# Patient Record
Sex: Female | Born: 1950
Health system: Southern US, Community
[De-identification: ages and names within clinical notes are randomized; demographics above are authoritative.]

## PROBLEM LIST (undated history)

## (undated) DIAGNOSIS — T7840XA Allergy, unspecified, initial encounter: Secondary | ICD-10-CM

## (undated) DIAGNOSIS — Z5189 Encounter for other specified aftercare: Secondary | ICD-10-CM

## (undated) DIAGNOSIS — C801 Malignant (primary) neoplasm, unspecified: Secondary | ICD-10-CM

## (undated) DIAGNOSIS — I10 Essential (primary) hypertension: Secondary | ICD-10-CM

## (undated) DIAGNOSIS — D72819 Decreased white blood cell count, unspecified: Secondary | ICD-10-CM

## (undated) DIAGNOSIS — C50919 Malignant neoplasm of unspecified site of unspecified female breast: Secondary | ICD-10-CM

## (undated) DIAGNOSIS — K219 Gastro-esophageal reflux disease without esophagitis: Secondary | ICD-10-CM

## (undated) DIAGNOSIS — E559 Vitamin D deficiency, unspecified: Secondary | ICD-10-CM

## (undated) HISTORY — DX: Malignant (primary) neoplasm, unspecified: C80.1

## (undated) HISTORY — PX: TUBAL LIGATION: SHX77

## (undated) HISTORY — PX: LIMBAL STEM CELL TRANSPLANT: SHX1969

## (undated) HISTORY — DX: Gastro-esophageal reflux disease without esophagitis: K21.9

## (undated) HISTORY — DX: Encounter for other specified aftercare: Z51.89

## (undated) HISTORY — DX: Allergy, unspecified, initial encounter: T78.40XA

## (undated) HISTORY — PX: MODIFIED RADICAL MASTECTOMY: SHX5703

## (undated) HISTORY — PX: MASTECTOMY: SHX3

## (undated) HISTORY — DX: Essential (primary) hypertension: I10

## (undated) HISTORY — PX: BREAST SURGERY: SHX581

## (undated) HISTORY — PX: WISDOM TOOTH EXTRACTION: SHX21

---

## 2012-05-05 DIAGNOSIS — I10 Essential (primary) hypertension: Secondary | ICD-10-CM | POA: Insufficient documentation

## 2012-05-05 DIAGNOSIS — Z853 Personal history of malignant neoplasm of breast: Secondary | ICD-10-CM | POA: Insufficient documentation

## 2012-05-05 DIAGNOSIS — L609 Nail disorder, unspecified: Secondary | ICD-10-CM | POA: Insufficient documentation

## 2012-05-05 DIAGNOSIS — IMO0001 Reserved for inherently not codable concepts without codable children: Secondary | ICD-10-CM | POA: Insufficient documentation

## 2012-05-05 DIAGNOSIS — K219 Gastro-esophageal reflux disease without esophagitis: Secondary | ICD-10-CM | POA: Insufficient documentation

## 2012-05-05 HISTORY — DX: Personal history of malignant neoplasm of breast: Z85.3

## 2012-09-25 DIAGNOSIS — D2360 Other benign neoplasm of skin of unspecified upper limb, including shoulder: Secondary | ICD-10-CM | POA: Insufficient documentation

## 2014-07-16 DIAGNOSIS — R942 Abnormal results of pulmonary function studies: Secondary | ICD-10-CM | POA: Insufficient documentation

## 2014-07-16 DIAGNOSIS — T783XXA Angioneurotic edema, initial encounter: Secondary | ICD-10-CM | POA: Insufficient documentation

## 2014-09-03 DIAGNOSIS — R059 Cough, unspecified: Secondary | ICD-10-CM | POA: Insufficient documentation

## 2014-12-22 DIAGNOSIS — C50911 Malignant neoplasm of unspecified site of right female breast: Secondary | ICD-10-CM | POA: Insufficient documentation

## 2017-01-24 DIAGNOSIS — C50911 Malignant neoplasm of unspecified site of right female breast: Secondary | ICD-10-CM | POA: Diagnosis not present

## 2017-02-04 ENCOUNTER — Encounter: Payer: Self-pay | Admitting: Osteopathic Medicine

## 2017-02-04 ENCOUNTER — Ambulatory Visit (INDEPENDENT_AMBULATORY_CARE_PROVIDER_SITE_OTHER): Payer: 59 | Admitting: Osteopathic Medicine

## 2017-02-04 VITALS — BP 141/88 | HR 58 | Ht 68.0 in | Wt 174.0 lb

## 2017-02-04 DIAGNOSIS — D229 Melanocytic nevi, unspecified: Secondary | ICD-10-CM

## 2017-02-04 DIAGNOSIS — Z0000 Encounter for general adult medical examination without abnormal findings: Secondary | ICD-10-CM | POA: Diagnosis not present

## 2017-02-04 DIAGNOSIS — I10 Essential (primary) hypertension: Secondary | ICD-10-CM

## 2017-02-04 DIAGNOSIS — Z853 Personal history of malignant neoplasm of breast: Secondary | ICD-10-CM

## 2017-02-04 LAB — CBC WITH DIFFERENTIAL/PLATELET
BASOS ABS: 0 {cells}/uL (ref 0–200)
Basophils Relative: 0 %
EOS ABS: 99 {cells}/uL (ref 15–500)
EOS PCT: 3 %
HCT: 38.3 % (ref 35.0–45.0)
HEMOGLOBIN: 12.2 g/dL (ref 11.7–15.5)
Lymphocytes Relative: 52 %
Lymphs Abs: 1716 cells/uL (ref 850–3900)
MCH: 28.6 pg (ref 27.0–33.0)
MCHC: 31.9 g/dL — AB (ref 32.0–36.0)
MCV: 89.9 fL (ref 80.0–100.0)
MONOS PCT: 6 %
MPV: 9.3 fL (ref 7.5–12.5)
Monocytes Absolute: 198 cells/uL — ABNORMAL LOW (ref 200–950)
NEUTROS ABS: 1287 {cells}/uL — AB (ref 1500–7800)
NEUTROS PCT: 39 %
PLATELETS: 183 10*3/uL (ref 140–400)
RBC: 4.26 MIL/uL (ref 3.80–5.10)
RDW: 14.7 % (ref 11.0–15.0)
WBC: 3.3 10*3/uL — ABNORMAL LOW (ref 3.8–10.8)

## 2017-02-04 LAB — COMPLETE METABOLIC PANEL WITH GFR
ALT: 43 U/L — ABNORMAL HIGH (ref 6–29)
AST: 27 U/L (ref 10–35)
Albumin: 4.4 g/dL (ref 3.6–5.1)
Alkaline Phosphatase: 96 U/L (ref 33–130)
BUN: 26 mg/dL — AB (ref 7–25)
CHLORIDE: 101 mmol/L (ref 98–110)
CO2: 26 mmol/L (ref 20–31)
CREATININE: 1.03 mg/dL — AB (ref 0.50–0.99)
Calcium: 9.4 mg/dL (ref 8.6–10.4)
GFR, Est African American: 66 mL/min (ref >=60)
GFR, Est Non African American: 58 mL/min — ABNORMAL LOW (ref >=60)
Glucose, Bld: 70 mg/dL (ref 65–99)
Potassium: 4.5 mmol/L (ref 3.5–5.3)
SODIUM: 138 mmol/L (ref 135–146)
TOTAL PROTEIN: 6.9 g/dL (ref 6.1–8.1)
Total Bilirubin: 0.3 mg/dL (ref 0.2–1.2)

## 2017-02-04 LAB — TSH: TSH: 1.26 m[IU]/L

## 2017-02-04 LAB — LIPID PANEL
Cholesterol: 200 mg/dL — ABNORMAL HIGH (ref <200)
HDL: 74 mg/dL (ref >50)
LDL CALC: 104 mg/dL — AB (ref <100)
Total CHOL/HDL Ratio: 2.7 Ratio (ref <5.0)
Triglycerides: 111 mg/dL (ref <150)
VLDL: 22 mg/dL (ref <30)

## 2017-02-04 NOTE — Progress Notes (Signed)
HPI: Sabrina Abbott is a 66 y.o. female  who presents to [place] today, 02/04/17,  for chief complaint of:  Chief Complaint  Patient presents with  . Establish Care    Very pleasant patient here to establish care. 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 History of angioedema on lisinopril-HCTZ combo. Has been working on diet/lifestyle changes but has gained a bit of weight since being laid off.  Hx R breast CA: Underwent treatment at [facility]. Mastectomy, chemotherapy, radiation, and stem cell therapy. No subsequent concerns on follow-up.  History incontinence: Not particularly bothersome to patient, occasional urgency  Labs reviewed: hx low WBC on and off since 2013  Skin concern: Mole has been present on mons pubis for several years, has not grown or changed in that time. She is thinking about getting this removed.  No medications at this time.   Fasting today in case labs needed.    Past medical, surgical, social and family history reviewed: Patient Active Problem List   Diagnosis Date Noted  . Malignant neoplasm of right breast ([place]) 12/22/2014  . Angioedema 07/16/2014  . Dermatofibroma of deltoid region 09/25/2012  . Elevated blood pressure 05/05/2012  . GERD (gastroesophageal reflux disease) 05/05/2012  . History of breast cancer 05/05/2012   Past Surgical History:  Procedure Laterality Date  . MASTECTOMY Right   . MODIFIED RADICAL MASTECTOMY Right   . WISDOM TOOTH EXTRACTION       Social History  Substance Use Topics  . Smoking status: Never Smoker  . Smokeless tobacco: Never Used  . Alcohol use No   Family History  Problem Relation Age of Onset  . Heart disease Father   . Alcohol abuse Father   . Hyperlipidemia Father   . Hypertension Father   . Stroke Father   . Prostate cancer Brother   . Diabetes Brother   .  Hypertension Brother   . Heart disease Sister      Current medication list and allergy/intolerance information reviewed:   No current outpatient prescriptions on file.   No current facility-administered medications for this visit.    Allergies  Allergen Reactions  . Iodinated Diagnostic Agents Other (See Comments)    Made one side of her "right side of body hurt real bad"  . Lisinopril Other (See Comments)      Review of Systems:  Constitutional:  No  fever, no chills, No recent illness, No unintentional weight changes. No significant fatigue.   HEENT: No  headache, no vision change, no hearing change, No sore throat, No  sinus pressure  Cardiac: No  chest pain, No  pressure, No palpitations  Respiratory:  No  shortness of breath. No  Cough  Gastrointestinal: No  abdominal pain, No  nausea, No  vomiting,  No  blood in stool, No  diarrhea, No  constipation   Musculoskeletal: No new myalgia/arthralgia  Genitourinary: +incontinence, No  abnormal genital bleeding, No abnormal genital discharge  Skin: No  Rash, +other concerning lesion as per HPI  Hem/Onc: No  easy bruising/bleeding, No  abnormal lymph node  Endocrine: No cold intolerance,  No heat intolerance. No polyuria/polydipsia/polyphagia   Neurologic: No  weakness, No  dizziness, No  slurred speech/focal weakness/facial droop  Psychiatric: No  concerns with depression, No  concerns with anxiety, No sleep problems, No mood problems  Depression screen PHQ  2/9 02/04/2017  Decreased Interest 0  Down, Depressed, Hopeless 0  PHQ - 2 Score 0     Exam:  BP (!) 141/88   Pulse (!) 58   Ht 5\' 8"  (1.727 m)   Wt 174 lb (78.9 kg)   BMI 26.46 kg/m   Constitutional: VS see above. General Appearance: alert, well-developed, well-nourished, NAD  Eyes: Normal lids and conjunctive, non-icteric sclera  Ears, Nose, Mouth, Throat: MMM, Normal external inspection ears/nares/mouth/lips/gums.  Neck: No masses, trachea midline.  No thyroid enlargement.  Respiratory: Normal respiratory effort. no wheeze, no rhonchi, no rales  Cardiovascular: S1/S2 normal, no murmur, no rub/gallop auscultated. RRR. No lower extremity edema.   Musculoskeletal: Gait normal.   Neurological: Normal balance/coordination. No tremor.   Skin: warm, dry, intact. No rash/ulcer. No concerning nevi or subq nodules on limited exam. About 0.7 cm diameter benign-appearing nevus on mons pubis, nontender, no erythema/inflammation.  Psychiatric: Normal judgment/insight. Normal mood and affect. Oriented x3.     ASSESSMENT/PLAN:   Essential hypertension - Advised consider medications, discussed goal blood pressure - Plan: CBC with Differential/Platelet, COMPLETE METABOLIC PANEL WITH GFR, Lipid panel, TSH, VITAMIN D 25 Hydroxy (Vit-D Deficiency, Fractures)  History of breast cancer - Plan: MM DIGITAL SCREENING BILATERAL, US BREAST COMPLETE UNI RIGHT INC AXILLA, US BREAST COMPLETE UNI LEFT INC AXILLA  Benign nevus of skin - Advised can remove if desired, appears benign    Patient Instructions  Plan:  We will request records form [facility] from your previous mammograms/breast cancer treatments and monitoring, but I advise that you get the disc with the images of your most recent mammograms to take with you to your appointment at the [facility].   Labs today for routine monitoring - we can discuss in detail at your upcoming annual  I would recommend considering getting back on blood pressure medication, we can talk about this more at your annual. Goal BP is 130/80 for most adults.      Visit summary with medication list and pertinent instructions was printed for patient to review. All questions at time of visit were answered - patient instructed to contact office with any additional concerns. ER/RTC precautions were reviewed with the patient. Follow-up plan: Return in about 4 weeks (around 03/04/2017) for [place] RECHECK BLOOD PRESSURE,  REVIEW LABS .

## 2017-02-04 NOTE — Patient Instructions (Signed)
Plan:  We will request records form [facility] from your previous mammograms/breast cancer treatments and monitoring, but I advise that you get the disc with the images of your most recent mammograms to take with you to your appointment at the [facility].   Labs today for routine monitoring - we can discuss in detail at your upcoming annual  I would recommend considering getting back on blood pressure medication, we can talk about this more at your annual. Goal BP is 130/80 for most adults.

## 2017-02-05 ENCOUNTER — Encounter: Payer: Self-pay | Admitting: Osteopathic Medicine

## 2017-02-05 LAB — VITAMIN D 25 HYDROXY (VIT D DEFICIENCY, FRACTURES): VIT D 25 HYDROXY: 21 ng/mL — AB (ref 30–100)

## 2017-02-06 ENCOUNTER — Other Ambulatory Visit: Payer: Self-pay | Admitting: Osteopathic Medicine

## 2017-02-06 DIAGNOSIS — Z1231 Encounter for screening mammogram for malignant neoplasm of breast: Secondary | ICD-10-CM

## 2017-02-06 DIAGNOSIS — Z9011 Acquired absence of right breast and nipple: Secondary | ICD-10-CM

## 2017-02-08 ENCOUNTER — Telehealth: Payer: Self-pay | Admitting: Osteopathic Medicine

## 2017-02-08 NOTE — Telephone Encounter (Signed)
Patient notified

## 2017-02-08 NOTE — Telephone Encounter (Signed)
I have the disc with her imaging studies on it. Left this up front for her to pick up. I can't upload this into the system, the patient should keep this as part of her records and take it with her to her mammograms.

## 2017-02-11 DIAGNOSIS — C50911 Malignant neoplasm of unspecified site of right female breast: Secondary | ICD-10-CM | POA: Diagnosis not present

## 2017-02-22 ENCOUNTER — Ambulatory Visit
Admission: RE | Admit: 2017-02-22 | Discharge: 2017-02-22 | Disposition: A | Discharge status: Home / Self Care | Payer: 59 | Source: Ambulatory Visit | Attending: Osteopathic Medicine | Admitting: Osteopathic Medicine

## 2017-02-22 DIAGNOSIS — Z1231 Encounter for screening mammogram for malignant neoplasm of breast: Secondary | ICD-10-CM | POA: Diagnosis not present

## 2017-02-22 DIAGNOSIS — Z9011 Acquired absence of right breast and nipple: Secondary | ICD-10-CM

## 2017-02-22 HISTORY — DX: Malignant neoplasm of unspecified site of unspecified female breast: C50.919

## 2017-03-05 ENCOUNTER — Encounter: Payer: Self-pay | Admitting: Osteopathic Medicine

## 2017-03-05 ENCOUNTER — Ambulatory Visit (INDEPENDENT_AMBULATORY_CARE_PROVIDER_SITE_OTHER): Payer: 59 | Admitting: Osteopathic Medicine

## 2017-03-05 VITALS — BP 123/78 | HR 56 | Ht 68.0 in | Wt 180.0 lb

## 2017-03-05 DIAGNOSIS — R748 Abnormal levels of other serum enzymes: Secondary | ICD-10-CM | POA: Diagnosis not present

## 2017-03-05 DIAGNOSIS — Z853 Personal history of malignant neoplasm of breast: Secondary | ICD-10-CM

## 2017-03-05 DIAGNOSIS — Z0000 Encounter for general adult medical examination without abnormal findings: Secondary | ICD-10-CM

## 2017-03-05 DIAGNOSIS — Z1211 Encounter for screening for malignant neoplasm of colon: Secondary | ICD-10-CM | POA: Diagnosis not present

## 2017-03-05 DIAGNOSIS — D72818 Other decreased white blood cell count: Secondary | ICD-10-CM | POA: Diagnosis not present

## 2017-03-05 DIAGNOSIS — D72819 Decreased white blood cell count, unspecified: Secondary | ICD-10-CM | POA: Insufficient documentation

## 2017-03-05 NOTE — Patient Instructions (Signed)
Plan:  Labs definitely within 6 months but you can get them sooner if you'd like to follow-up on the liver enzymes. Orders are in!  Silicone-based lubricant for postmenopausal   Vitamin D [phone] units daily, Calcium 1300 mg daily Keep a record of you rblood pressures at home and bring this with you to your next visit. Goal is 130/80, if consistently higher than this, we will want to think about restarting blood pressure medications.  We will follow-up on blood pressure in 6 months for sure, but come see me sooner if needed!

## 2017-03-05 NOTE — Progress Notes (Signed)
HPI: Sabrina Abbott is a 66 y.o. female  who presents to [place] today, 03/05/17,  for chief complaint of:  Chief Complaint  Patient presents with  . Annual Exam      Patient here for annual physical / wellness exam.  See preventive care reviewed as below.  Recent labs reviewed in detail with the patient. Mild low WBC chrnoic, new mild elevation one liver enzyme.   Additional concerns today include: none    Past medical, surgical, social and family history reviewed: Patient Active Problem List   Diagnosis Date Noted  . Malignant neoplasm of right breast ([place]) 12/22/2014  . Angioedema 07/16/2014  . Dermatofibroma of deltoid region 09/25/2012  . Elevated blood pressure 05/05/2012  . GERD (gastroesophageal reflux disease) 05/05/2012  . History of breast cancer 05/05/2012   Past Surgical History:  Procedure Laterality Date  . MASTECTOMY Right   . MODIFIED RADICAL MASTECTOMY Right   . WISDOM TOOTH EXTRACTION     Social History  Substance Use Topics  . Smoking status: Never Smoker  . Smokeless tobacco: Never Used  . Alcohol use No   Family History  Problem Relation Age of Onset  . Heart disease Father   . Alcohol abuse Father   . Hyperlipidemia Father   . Hypertension Father   . Stroke Father   . Prostate cancer Brother   . Diabetes Brother   . Hypertension Brother   . Heart disease Sister      Current medication list and allergy/intolerance information reviewed:   No current outpatient prescriptions on file.   No current facility-administered medications for this visit.    Allergies  Allergen Reactions  . Iodinated Diagnostic Agents Other (See Comments)    Made one side of her "right side of body hurt real bad"  . Lisinopril Other (See Comments)      Review of Systems:  Constitutional:  No  fever, no chills, No recent illness, No unintentional weight changes. No significant fatigue.   HEENT: No  headache, no  vision change, no hearing change, No sore throat, No  sinus pressure  Cardiac: No  chest pain, No  pressure, No palpitations, No  Orthopnea  Respiratory:  No  shortness of breath. No  Cough  Gastrointestinal: No  abdominal pain, No  nausea, No  vomiting,  No  blood in stool, No  diarrhea, No  constipation   Musculoskeletal: No new myalgia/arthralgia  Genitourinary: No  incontinence, No  abnormal genital bleeding, No abnormal genital discharge  Skin: No  Rash, No other wounds/concerning lesions  Hem/Onc: No  easy bruising/bleeding, No  abnormal lymph node  Endocrine: No cold intolerance,  No heat intolerance. No polyuria/polydipsia/polyphagia   Neurologic: No  weakness, No  dizziness, No  slurred speech/focal weakness/facial droop  Psychiatric: No  concerns with depression, No  concerns with anxiety, No sleep problems, No mood problems  Exam:  BP 123/78   Pulse (!) 56   Ht 5\' 8"  (1.727 m)   Wt 180 lb (81.6 kg)   BMI 27.37 kg/m   Constitutional: VS see above. General Appearance: alert, well-developed, well-nourished, NAD  Eyes: Normal lids and conjunctive, non-icteric sclera  Ears, Nose, Mouth, Throat: MMM, Normal external inspection ears/nares/mouth/lips/gums. TM normal bilaterally. Pharynx/tonsils no erythema, no exudate. Nasal mucosa normal.   Neck: No masses, trachea midline. No thyroid enlargement. No tenderness/mass appreciated. No lymphadenopathy  Respiratory: Normal respiratory effort. no wheeze, no rhonchi, no rales  Cardiovascular: S1/S2 normal, no murmur,  no rub/gallop auscultated. RRR. No lower extremity edema.   Gastrointestinal: Nontender, no masses. No hepatomegaly, no splenomegaly. No hernia appreciated. Bowel sounds normal. Rectal exam deferred.   Musculoskeletal: Gait normal. No clubbing/cyanosis of digits.   Neurological: Normal balance/coordination. No tremor. No cranial nerve deficit on limited exam.   Skin: warm, dry, intact. No rash/ulcer.    Psychiatric: Normal judgment/insight. Normal mood and affect. Oriented x3.     ASSESSMENT/PLAN: to recheck labs in 1 month or so, f/u liver enz and WBC, WBC chronically low. BP improved on recheck, q6 mos f/u.   Annual physical exam  Colon cancer screening - Plan: Cologuard  History of breast cancer  Elevated liver enzymes - Plan: COMPLETE METABOLIC PANEL WITH GFR  Other decreased white blood cell (WBC) count - Plan: CBC with Differential/Platelet    FEMALE PREVENTIVE CARE Updated 03/05/17   ANNUAL SCREENING/COUNSELING  Diet/Exercise - HEALTHY HABITS DISCUSSED TO DECREASE CV RISK History  Smoking Status  . Never Smoker  Smokeless Tobacco  . Never Used   History  Alcohol Use No   Depression screen PHQ 2/9 02/04/2017  Decreased Interest 0  Down, Depressed, Hopeless 0  PHQ - 2 Score 0    Domestic violence concerns - no  HTN SCREENING - SEE [place]  Sexually active in the past year - Yes with female.  Need/want STI testing today? - no  Concerns about libido or pain with sex? - on occasion  Plans for pregnancy? - n/a - postmenopausal   INFECTIOUS DISEASE SCREENING  HIV - states previously negative  GC/CT - does not need  HepC - states previously negative  TB - does not need  DISEASE SCREENING  Lipid - does not need  DM2 - does not need  Osteoporosis - women age 58+ - does not need  Fracture after age 21? no  Chronic steroid use? no  Smoking/Alcohol? no  Low body weight <127lb? no  Hip fracture in parent? no  Premature menopause, malabsorbtion, CLD, IBD, RA? no  CANCER SCREENING  Cervical - does not need  Breast - does not need - recent neg mammo  Lung - does not need  Colon - needs - Cologuard  ADULT VACCINATION  Influenza - annual vaccine recommended  Td - booster every 10 years   Zoster - option at 51, yes at 60+   PCV13 - was not indicated  PPSV23 - was not indicated  There is no immunization history  on file for this patient.     Patient Instructions  Plan:  Labs definitely within 6 months but you can get them sooner if you'd like to follow-up on the liver enzymes. Orders are in!  Silicone-based lubricant for postmenopausal   Vitamin D [phone] units daily, Calcium 1300 mg daily Keep a record of you rblood pressures at home and bring this with you to your next visit. Goal is 130/80, if consistently higher than this, we will want to think about restarting blood pressure medications.  We will follow-up on blood pressure in 6 months for sure, but come see me sooner if needed!      Visit summary with medication list and pertinent instructions was printed for patient to review. All questions at time of visit were answered - patient instructed to contact office with any additional concerns. ER/RTC precautions were reviewed with the patient. Follow-up plan: Return in about 6 months (around 09/05/2017) for recheck BP, sooner if needed.

## 2017-03-14 DIAGNOSIS — Z1211 Encounter for screening for malignant neoplasm of colon: Secondary | ICD-10-CM | POA: Diagnosis not present

## 2017-03-14 DIAGNOSIS — Z1212 Encounter for screening for malignant neoplasm of rectum: Secondary | ICD-10-CM | POA: Diagnosis not present

## 2017-03-15 LAB — COLOGUARD: COLOGUARD: NEGATIVE

## 2017-03-25 ENCOUNTER — Telehealth: Payer: Self-pay | Admitting: Osteopathic Medicine

## 2017-03-25 DIAGNOSIS — Z1211 Encounter for screening for malignant neoplasm of colon: Secondary | ICD-10-CM | POA: Insufficient documentation

## 2017-03-25 NOTE — Telephone Encounter (Signed)
Left detailed message on patient vm  With results as noted below. Rhonda Cunningham,CMA

## 2017-03-25 NOTE — Telephone Encounter (Signed)
Please call patient: Cologuard testing was negative, will be due for another cologuard in 3 years

## 2017-04-04 ENCOUNTER — Encounter: Payer: Self-pay | Admitting: Osteopathic Medicine

## 2017-04-04 DIAGNOSIS — D72818 Other decreased white blood cell count: Secondary | ICD-10-CM | POA: Diagnosis not present

## 2017-04-04 DIAGNOSIS — R748 Abnormal levels of other serum enzymes: Secondary | ICD-10-CM | POA: Diagnosis not present

## 2017-04-04 LAB — COMPLETE METABOLIC PANEL WITH GFR
AG RATIO: 1.4 (calc) (ref 1.0–2.5)
ALKALINE PHOSPHATASE (APISO): 86 U/L (ref 33–130)
ALT: 18 U/L (ref 6–29)
AST: 18 U/L (ref 10–35)
Albumin: 3.8 g/dL (ref 3.6–5.1)
BILIRUBIN TOTAL: 0.3 mg/dL (ref 0.2–1.2)
BUN: 19 mg/dL (ref 7–25)
CHLORIDE: 110 mmol/L (ref 98–110)
CO2: 25 mmol/L (ref 20–32)
Calcium: 9.3 mg/dL (ref 8.6–10.4)
Creat: 0.97 mg/dL (ref 0.50–0.99)
GFR, Est African American: 72 mL/min/{1.73_m2} (ref >=60)
GFR, Est Non African American: 62 mL/min/{1.73_m2} (ref >=60)
Globulin: 2.8 g/dL (calc) (ref 1.9–3.7)
Glucose, Bld: 78 mg/dL (ref 65–99)
POTASSIUM: 4.9 mmol/L (ref 3.5–5.3)
Sodium: 141 mmol/L (ref 135–146)
Total Protein: 6.6 g/dL (ref 6.1–8.1)

## 2017-04-04 LAB — CBC WITH DIFFERENTIAL/PLATELET
BASOS ABS: 21 {cells}/uL (ref 0–200)
Basophils Relative: 0.6 %
EOS ABS: 445 {cells}/uL (ref 15–500)
Eosinophils Relative: 12.7 %
HCT: 34.4 % — ABNORMAL LOW (ref 35.0–45.0)
Hemoglobin: 11.1 g/dL — ABNORMAL LOW (ref 11.7–15.5)
Lymphs Abs: 1327 cells/uL (ref 850–3900)
MCH: 28.1 pg (ref 27.0–33.0)
MCHC: 32.3 g/dL (ref 32.0–36.0)
MCV: 87.1 fL (ref 80.0–100.0)
MPV: 10.4 fL (ref 7.5–12.5)
Monocytes Relative: 11.9 %
NEUTROS PCT: 36.9 %
Neutro Abs: 1292 cells/uL — ABNORMAL LOW (ref 1500–7800)
PLATELETS: 165 10*3/uL (ref 140–400)
RBC: 3.95 10*6/uL (ref 3.80–5.10)
RDW: 13 % (ref 11.0–15.0)
TOTAL LYMPHOCYTE: 37.9 %
WBC mixed population: 417 cells/uL (ref 200–950)
WBC: 3.5 10*3/uL — ABNORMAL LOW (ref 3.8–10.8)

## 2017-04-10 ENCOUNTER — Encounter: Payer: Self-pay | Admitting: Osteopathic Medicine

## 2017-04-24 ENCOUNTER — Encounter: Payer: Self-pay | Admitting: Osteopathic Medicine

## 2017-06-03 DIAGNOSIS — C50911 Malignant neoplasm of unspecified site of right female breast: Secondary | ICD-10-CM | POA: Diagnosis not present

## 2017-07-10 ENCOUNTER — Encounter: Payer: Self-pay | Admitting: Osteopathic Medicine

## 2017-07-19 ENCOUNTER — Telehealth: Payer: Self-pay | Admitting: Osteopathic Medicine

## 2017-07-19 NOTE — Telephone Encounter (Signed)
Left message advising of recommendations.  

## 2017-07-19 NOTE — Telephone Encounter (Signed)
Please call patient: I completed the forms that she dropped off here with her daughter for insurance coverage. These are up front for her to pick up.   I am not able to send them since she did not sign a release on the last pages of these. She can come in and signed them and we can send them, or she can just take the paperwork herself wherever it needs to go. Any questions, let me know!

## 2017-07-30 ENCOUNTER — Encounter: Payer: Self-pay | Admitting: Osteopathic Medicine

## 2017-09-05 ENCOUNTER — Ambulatory Visit (INDEPENDENT_AMBULATORY_CARE_PROVIDER_SITE_OTHER): Payer: 59 | Admitting: Osteopathic Medicine

## 2017-09-05 ENCOUNTER — Encounter: Payer: Self-pay | Admitting: Osteopathic Medicine

## 2017-09-05 VITALS — BP 146/81 | HR 65 | Temp 97.8°F | Wt 176.1 lb

## 2017-09-05 DIAGNOSIS — D72818 Other decreased white blood cell count: Secondary | ICD-10-CM

## 2017-09-05 DIAGNOSIS — R0789 Other chest pain: Secondary | ICD-10-CM | POA: Diagnosis not present

## 2017-09-05 DIAGNOSIS — I10 Essential (primary) hypertension: Secondary | ICD-10-CM

## 2017-09-05 DIAGNOSIS — Z853 Personal history of malignant neoplasm of breast: Secondary | ICD-10-CM

## 2017-09-05 MED ORDER — HYDROCHLOROTHIAZIDE 12.5 MG PO TABS: 12.5000 mg | ORAL_TABLET | Freq: Every day | ORAL | 1 refills | Status: DC

## 2017-09-05 NOTE — Progress Notes (Signed)
HPI: Sabrina Abbott is a 67 y.o. female  who presents to [place] today, 09/05/17,  for chief complaint of:  Follow-up HTN Occasional chest pains    She works as an Therapist, sports, previously at [employer], now working for [employer]  HTN: took herself off medicines awhile ago. History of angioedema on lisinopril-HCTZ combo. On initial visit 01/2017 BP was 141/88, recheck at annual 02/2017 was better at 123/78. She is just here today to follow-up BP given her history. BP on intake 146/81. She is checking BP outside the office: 120s on a few occasion but mostly 993T systolic, diastolic usually in 70V.   Reports occasional chest pains. Not too bothersome, happens occasionally. Seems to happen a few days after eating broccoli. No CP on exertion, no claudication. Located L upper chest, feels like a throbbing/toothache type pain.   Hx R breast CA: Underwent treatment at [facility]. Mastectomy, chemotherapy, radiation, and stem cell therapy. No subsequent concerns on follow-up. L side chest pain not in area of previous radiation.   Labs reviewed: hx low WBC on and off since 2013, stable. Last checked 03/2017.   No medications at this time.   Most recent labs reviewed as below.    Past medical, surgical, social and family history reviewed: Patient Active Problem List   Diagnosis Date Noted  . Colon cancer screening 03/25/2017  . Elevated liver enzymes 03/05/2017  . Leucopenia 03/05/2017  . Malignant neoplasm of right breast ([place]) 12/22/2014  . Angioedema 07/16/2014  . Dermatofibroma of deltoid region 09/25/2012  . Elevated blood pressure 05/05/2012  . GERD (gastroesophageal reflux disease) 05/05/2012  . History of breast cancer 05/05/2012   Past Surgical History:  Procedure Laterality Date  . MASTECTOMY Right   . MODIFIED RADICAL MASTECTOMY Right   . WISDOM TOOTH EXTRACTION       Social History   Tobacco Use  . Smoking status: Never Smoker  . Smokeless  tobacco: Never Used  Substance Use Topics  . Alcohol use: No   Family History  Problem Relation Age of Onset  . Heart disease Father   . Alcohol abuse Father   . Hyperlipidemia Father   . Hypertension Father   . Stroke Father   . Prostate cancer Brother   . Diabetes Brother   . Hypertension Brother   . Heart disease Sister      Current medication list and allergy/intolerance information reviewed:   No current outpatient medications on file.   No current facility-administered medications for this visit.    Allergies  Allergen Reactions  . Iodinated Diagnostic Agents Other (See Comments)    Made one side of her "right side of body hurt real bad"  . Lisinopril Other (See Comments)      Review of Systems:  Constitutional:  No  fever, no chills, No recent illness, No unintentional weight changes. No significant fatigue.   HEENT: No  headache, no vision change  Cardiac: +chest pain, No  pressure, No palpitations, no orthopnea  Respiratory:  No  shortness of breath. No  Cough  Gastrointestinal: No  abdominal pain, No  nausea, No  vomiting  Musculoskeletal: No new myalgia/arthralgia  Endocrine: No cold intolerance,  No heat intolerance. No polyuria/polydipsia/polyphagia   Neurologic: No  weakness, No  dizziness    Exam:  BP (!) 146/81   Pulse 65   Temp 97.8 F (36.6 C) (Oral)   Wt 176 lb 1.9 oz (79.9 kg)   BMI 26.78 kg/m  Constitutional: VS see above. General Appearance: alert, well-developed, well-nourished, NAD  Eyes: Normal lids and conjunctive, non-icteric sclera  Ears, Nose, Mouth, Throat: MMM, Normal external inspection ears/nares/mouth/lips/gums.  Neck: No masses, trachea midline. No thyroid enlargement.  Respiratory: Normal respiratory effort. no wheeze, no rhonchi, no rales  Cardiovascular: S1/S2 normal, no murmur, no rub/gallop auscultated. RRR. No lower extremity edema.   Chest: pain on L not reproducible on palpation. Otherwise normal chest  wall motion. Surgically absent R breast   Musculoskeletal: Gait normal.   Neurological: Normal balance/coordination. No tremor.   Skin: warm, dry, intact. No rash/ulcer.   Psychiatric: Normal judgment/insight. Normal mood and affect. Oriented x3.   EKG interpretation: Rate: 61 Rhythm: sinus Very slightly prolonged PR interval at 222 does meet criteria for 1st degree AVB (>200) No ST/T changes concerning for acute ischemia/infarct  Previous EKG none available for review     ASSESSMENT/PLAN:   Other chest pain - intermittent, non-exertional nature seems unlikely to be cardiac. Discussed stress test vs see if dietary triggers. ER precautions reviewed.   Essential hypertension - Discussed goal BP 130/80 or less. Wlil RTC for [place] to recheck on meds and confirm home BP monitor  - Plan: EKG 12-Lead, COMPLETE METABOLIC PANEL WITH GFR  History of breast cancer - R side likely not relevat to L sided chest wall pain   Other decreased white blood cell (WBC) count - stable last check 03/2017 - Plan: CBC with Differential/Platelet      Visit summary with medication list and pertinent instructions was printed for patient to review. All questions at time of visit were answered - patient instructed to contact office with any additional concerns. ER/RTC precautions were reviewed with the patient.   Follow-up plan: Return for annual physical and routine labs due 02/2018. Nurse visit 4-6 week to recheck BP and home BP monitor.

## 2017-10-10 ENCOUNTER — Ambulatory Visit (INDEPENDENT_AMBULATORY_CARE_PROVIDER_SITE_OTHER): Payer: 59 | Admitting: Sports Medicine

## 2017-10-10 ENCOUNTER — Encounter: Payer: Self-pay | Admitting: Sports Medicine

## 2017-10-10 VITALS — BP 126/74 | HR 58 | Temp 98.1°F | Wt 176.1 lb

## 2017-10-10 DIAGNOSIS — I10 Essential (primary) hypertension: Secondary | ICD-10-CM

## 2017-10-10 NOTE — Progress Notes (Signed)
Pt presented today for blood pressure check. Denies palpitations, dizziness, lightheadedness, chest pain or SOB. Pt has had no missed dose with medication. Pt's last blood pressure reading was 146 /81, pulse 65 on 09/05/17. Today's first reading for blood pressure reading was 138/78, pulse 63. Pt brought in their portable blood pressure machine for accuracy check. Blood pressure reading with pt's machine was 133/78, pulse 61. Pt's blood pressure records on their blood pressure machine has been in the range of 130's/80's. Pt sat for 10 minutes for second blood pressure reading using clinic machine, reading was 126/74, pulse 58. Pt was informed during nurse visit she is due for first injection of pneumonia vaccine. Pt declined to have vaccine today. As per pt, she will have vaccine done during follow up appt with PCP. Pt notified she will get a call for any update or changes.

## 2017-11-08 ENCOUNTER — Encounter: Payer: Self-pay | Admitting: Osteopathic Medicine

## 2017-11-08 MED ORDER — HYDROCHLOROTHIAZIDE 12.5 MG PO TABS: 12.5000 mg | ORAL_TABLET | Freq: Every day | ORAL | 1 refills | Status: DC

## 2017-11-08 MED FILL — HYDROCHLOROTHIAZIDE 12.5 MG: 12.5 | 90 days supply | Qty: 90 | Fill #0

## 2018-01-23 ENCOUNTER — Other Ambulatory Visit: Payer: Self-pay | Admitting: Osteopathic Medicine

## 2018-01-23 DIAGNOSIS — Z1231 Encounter for screening mammogram for malignant neoplasm of breast: Secondary | ICD-10-CM

## 2018-02-25 ENCOUNTER — Ambulatory Visit
Admission: RE | Admit: 2018-02-25 | Discharge: 2018-02-25 | Disposition: A | Discharge status: Home / Self Care | Payer: 59 | Source: Ambulatory Visit | Attending: Osteopathic Medicine | Admitting: Osteopathic Medicine

## 2018-02-25 DIAGNOSIS — Z1231 Encounter for screening mammogram for malignant neoplasm of breast: Secondary | ICD-10-CM | POA: Diagnosis not present

## 2018-02-27 DIAGNOSIS — I10 Essential (primary) hypertension: Secondary | ICD-10-CM | POA: Diagnosis not present

## 2018-02-27 DIAGNOSIS — D72818 Other decreased white blood cell count: Secondary | ICD-10-CM | POA: Diagnosis not present

## 2018-02-28 LAB — CBC WITH DIFFERENTIAL/PLATELET
Basophils Absolute: 20 cells/uL (ref 0–200)
Basophils Relative: 0.7 %
EOS PCT: 3.1 %
Eosinophils Absolute: 90 cells/uL (ref 15–500)
HEMATOCRIT: 36.8 % (ref 35.0–45.0)
HEMOGLOBIN: 11.7 g/dL (ref 11.7–15.5)
Lymphs Abs: 1389 cells/uL (ref 850–3900)
MCH: 28.1 pg (ref 27.0–33.0)
MCHC: 31.8 g/dL — AB (ref 32.0–36.0)
MCV: 88.5 fL (ref 80.0–100.0)
MPV: 10.1 fL (ref 7.5–12.5)
Monocytes Relative: 11.6 %
NEUTROS ABS: 1064 {cells}/uL — AB (ref 1500–7800)
Neutrophils Relative %: 36.7 %
Platelets: 178 10*3/uL (ref 140–400)
RBC: 4.16 10*6/uL (ref 3.80–5.10)
RDW: 13.6 % (ref 11.0–15.0)
TOTAL LYMPHOCYTE: 47.9 %
WBC: 2.9 10*3/uL — AB (ref 3.8–10.8)
WBCMIX: 336 {cells}/uL (ref 200–950)

## 2018-02-28 LAB — COMPLETE METABOLIC PANEL WITH GFR
AG Ratio: 1.5 (calc) (ref 1.0–2.5)
ALT: 18 U/L (ref 6–29)
AST: 21 U/L (ref 10–35)
Albumin: 4 g/dL (ref 3.6–5.1)
Alkaline phosphatase (APISO): 70 U/L (ref 33–130)
BILIRUBIN TOTAL: 0.3 mg/dL (ref 0.2–1.2)
BUN/Creatinine Ratio: 23 (calc) — ABNORMAL HIGH (ref 6–22)
BUN: 24 mg/dL (ref 7–25)
CHLORIDE: 104 mmol/L (ref 98–110)
CO2: 26 mmol/L (ref 20–32)
Calcium: 9.4 mg/dL (ref 8.6–10.4)
Creat: 1.06 mg/dL — ABNORMAL HIGH (ref 0.50–0.99)
GFR, EST AFRICAN AMERICAN: 64 mL/min/{1.73_m2} (ref >=60)
GFR, Est Non African American: 55 mL/min/{1.73_m2} — ABNORMAL LOW (ref >=60)
GLUCOSE: 74 mg/dL (ref 65–99)
Globulin: 2.6 g/dL (calc) (ref 1.9–3.7)
Potassium: 4.6 mmol/L (ref 3.5–5.3)
Sodium: 138 mmol/L (ref 135–146)
TOTAL PROTEIN: 6.6 g/dL (ref 6.1–8.1)

## 2018-03-06 ENCOUNTER — Encounter: Payer: Self-pay | Admitting: Osteopathic Medicine

## 2018-03-06 ENCOUNTER — Ambulatory Visit (INDEPENDENT_AMBULATORY_CARE_PROVIDER_SITE_OTHER): Payer: 59 | Admitting: Osteopathic Medicine

## 2018-03-06 VITALS — BP 134/83 | HR 57 | Temp 97.7°F | Wt 182.6 lb

## 2018-03-06 DIAGNOSIS — I10 Essential (primary) hypertension: Secondary | ICD-10-CM | POA: Diagnosis not present

## 2018-03-06 DIAGNOSIS — Z13820 Encounter for screening for osteoporosis: Secondary | ICD-10-CM | POA: Diagnosis not present

## 2018-03-06 DIAGNOSIS — Z23 Encounter for immunization: Secondary | ICD-10-CM | POA: Diagnosis not present

## 2018-03-06 DIAGNOSIS — Z0000 Encounter for general adult medical examination without abnormal findings: Secondary | ICD-10-CM | POA: Diagnosis not present

## 2018-03-06 DIAGNOSIS — D72818 Other decreased white blood cell count: Secondary | ICD-10-CM

## 2018-03-06 DIAGNOSIS — Z853 Personal history of malignant neoplasm of breast: Secondary | ICD-10-CM

## 2018-03-06 NOTE — Patient Instructions (Addendum)
General Preventive Care  Most recent routine screening lipids/other labs: will complete these today   Tobacco: don't! Alcohol: moderation is ok for most people. Recreational/Illicit Drugs: don't!  Exercise: as tolerated to reduce risk of cardiovascular disease and diabetes  Mental health: if need for mental health care (medicines, counseling, other), or concerns about moods, please let me know!   Sexual health: if need for STD testing, or if pain/libido concerns, please let me know!   Vaccines  Flu vaccine: recommended every fall (by Halloween!)  Shingles vaccine: Shingrix recommended after age 45 - you've had the older Zostavax vaccine, if you'd like to update with the SHingrix vaccine, please let us know!   Pneumonia vaccines: Pneumovax recommended after age 90  Tetanus booster: Tdap recommended every 10 years  Cancer screenings   Colon cancer screening: recommended at age 57, cologuard was done for you already  Breast cancer screening: mammogram recommended annually  Cervical cancer screening: Can stop at age 28 or w/ hysterectomy as long as previous testing was normal.   Infection screenings . HIV: recommended screening at least once age 77-65, more often if risk factors  . Gonorrhea/Chlamydia: screening as needed . Hepatitis C: recommended once for anyone born 75-1964  Other . Bone Density Test: recommended for women at age 8 . Advanced Directive: Living Will and/or Healthcare Power of Attorney recommended for everyone, regardless of age or health . Cholesterol: recommended screening annually . Diabetes: recommended screening annually

## 2018-03-06 NOTE — Progress Notes (Signed)
HPI: Sabrina Abbott is a 67 y.o. female  who presents to [facility] today, 03/06/18,  for chief complaint of:  Annual Physical     Patient here for annual physical / wellness exam.  See preventive care reviewed as below.  Recent labs reviewed in detail with the patient. Mild low WBC chrnoic but a bit more pronounced on this recent lab draw. Liver enzymes ok.   Additional concerns today include: none    Past medical, surgical, social and family history reviewed: Patient Active Problem List   Diagnosis Date Noted  . Colon cancer screening 03/25/2017  . Elevated liver enzymes 03/05/2017  . Leucopenia 03/05/2017  . Malignant neoplasm of right breast ([place]) 12/22/2014  . Angioedema 07/16/2014  . Dermatofibroma of deltoid region 09/25/2012  . Elevated blood pressure 05/05/2012  . GERD (gastroesophageal reflux disease) 05/05/2012  . History of breast cancer 05/05/2012   Past Surgical History:  Procedure Laterality Date  . MASTECTOMY Right   . MODIFIED RADICAL MASTECTOMY Right   . WISDOM TOOTH EXTRACTION     Social History   Tobacco Use  . Smoking status: Never Smoker  . Smokeless tobacco: Never Used  Substance Use Topics  . Alcohol use: No   Family History  Problem Relation Age of Onset  . Heart disease Father   . Alcohol abuse Father   . Hyperlipidemia Father   . Hypertension Father   . Stroke Father   . Prostate cancer Brother   . Diabetes Brother   . Hypertension Brother   . Heart disease Sister      Current medication list and allergy/intolerance information reviewed:   Current Outpatient Medications  Medication Sig Dispense Refill  . hydrochlorothiazide (HYDRODIURIL) 12.5 MG tablet Take 1 tablet (12.5 mg total) by mouth daily. 90 tablet 1   No current facility-administered medications for this visit.    Allergies  Allergen Reactions  . Iodinated Diagnostic Agents Other (See Comments)    Made one side of her "right side  of body hurt real bad"  . Lisinopril Other (See Comments)  . Iodine       Review of Systems:  Constitutional:  No  fever, no chills, No recent illness, No unintentional weight changes. No significant fatigue.   HEENT: No  headache, no vision change, no hearing change, No sore throat, No  sinus pressure  Cardiac: No  chest pain, No  pressure, No palpitations, No  Orthopnea  Respiratory:  No  shortness of breath. No  Cough  Gastrointestinal: No  abdominal pain, No  nausea, No  vomiting,  No  blood in stool, No  diarrhea, No  constipation   Musculoskeletal: No new myalgia/arthralgia  Genitourinary: No  incontinence, No  abnormal genital bleeding, No abnormal genital discharge  Skin: No  Rash, No other wounds/concerning lesions  Hem/Onc: No  easy bruising/bleeding, No  abnormal lymph node  Endocrine: No cold intolerance,  No heat intolerance. No polyuria/polydipsia/polyphagia   Neurologic: No  weakness, No  dizziness, No  slurred speech/focal weakness/facial droop  Psychiatric: No  concerns with depression, No  concerns with anxiety, No sleep problems, No mood problems  Exam:  BP 134/83 (BP Location: Left Arm, Patient Position: Sitting, Cuff Size: Normal)   Pulse (!) 57   Temp 97.7 F (36.5 C) (Oral)   Wt 182 lb 9.6 oz (82.8 kg)   BMI 27.76 kg/m   Constitutional: VS see above. General Appearance: alert, well-developed, well-nourished, NAD  Eyes: Normal lids and  conjunctive, non-icteric sclera  Ears, Nose, Mouth, Throat: MMM, Normal external inspection ears/nares/mouth/lips/gums. TM normal bilaterally. Pharynx/tonsils no erythema, no exudate. Nasal mucosa normal.   Neck: No masses, trachea midline. No thyroid enlargement. No tenderness/mass appreciated. No lymphadenopathy  Respiratory: Normal respiratory effort. no wheeze, no rhonchi, no rales  Cardiovascular: S1/S2 normal, no murmur, no rub/gallop auscultated. RRR. No lower extremity edema.   Gastrointestinal:  Nontender, no masses. No hepatomegaly, no splenomegaly. No hernia appreciated. Bowel sounds normal. Rectal exam deferred.   Musculoskeletal: Gait normal. No clubbing/cyanosis of digits.   Neurological: Normal balance/coordination. No tremor. No cranial nerve deficit on limited exam.   Skin: warm, dry, intact. No rash/ulcer.   Psychiatric: Normal judgment/insight. Normal mood and affect. Oriented x3.         ASSESSMENT/PLAN: to recheck labs in 1 month or so, f/u liver enz and WBC, WBC chronically low. BP improved on recheck, q6 mos f/u.   Annual physical exam - Plan: DG Bone Density, Lipid panel, CBC with Differential/Platelet, TSH, VITAMIN D 25 Hydroxy (Vit-D Deficiency, Fractures)  Other decreased white blood cell (WBC) count - Plan: Lipid panel, CBC with Differential/Platelet, TSH, VITAMIN D 25 Hydroxy (Vit-D Deficiency, Fractures)  History of breast cancer - Plan: Lipid panel, CBC with Differential/Platelet, TSH, VITAMIN D 25 Hydroxy (Vit-D Deficiency, Fractures)  Essential hypertension - Plan: Lipid panel, CBC with Differential/Platelet, TSH, VITAMIN D 25 Hydroxy (Vit-D Deficiency, Fractures)  Screening for osteoporosis - Plan: DG Bone Density  Need for pneumococcal vaccination - Plan: Pneumococcal polysaccharide vaccine 23-valent greater than or equal to 2yo subcutaneous/IM     Immunization History  Administered Date(s) Administered  . Influenza, High Dose Seasonal PF 05/09/2015  . Influenza-Unspecified 04/14/2012, 04/28/2013, 05/03/2014, 05/14/2016, 04/02/2017  . Tdap 07/09/2005  . Zoster 12/23/2013       Patient Instructions  General Preventive Care  Most recent routine screening lipids/other labs: will complete these today   Tobacco: don't! Alcohol: moderation is ok for most people. Recreational/Illicit Drugs: don't!  Exercise: as tolerated to reduce risk of cardiovascular disease and diabetes  Mental health: if need for mental health care (medicines,  counseling, other), or concerns about moods, please let me know!   Sexual health: if need for STD testing, or if pain/libido concerns, please let me know!   Vaccines  Flu vaccine: recommended every fall (by Halloween!)  Shingles vaccine: Shingrix recommended after age 22 - you've had the older Zostavax vaccine, if you'd like to update with the SHingrix vaccine, please let us know!   Pneumonia vaccines: Pneumovax recommended after age 32  Tetanus booster: Tdap recommended every 10 years  Cancer screenings   Colon cancer screening: recommended at age 62, cologuard was done for you already  Breast cancer screening: mammogram recommended annually  Cervical cancer screening: Can stop at age 37 or w/ hysterectomy as long as previous testing was normal.   Infection screenings . HIV: recommended screening at least once age 30-65, more often if risk factors  . Gonorrhea/Chlamydia: screening as needed . Hepatitis C: recommended once for anyone born 39-1964  Other . Bone Density Test: recommended for women at age 27 . Advanced Directive: Living Will and/or Healthcare Power of Attorney recommended for everyone, regardless of age or health . Cholesterol: recommended screening annually . Diabetes: recommended screening annually     Visit summary with medication list and pertinent instructions was printed for patient to review. All questions at time of visit were answered - patient instructed to contact office with any additional concerns. ER/RTC  precautions were reviewed with the patient. Follow-up plan: Return in about 6 months (around 09/06/2018) for monitor blood pressure and labs if needed, 1 year annual physical .

## 2018-03-07 ENCOUNTER — Encounter: Payer: Self-pay | Admitting: Osteopathic Medicine

## 2018-03-07 LAB — LIPID PANEL
CHOL/HDL RATIO: 3 (calc) (ref <5.0)
Cholesterol: 191 mg/dL (ref <200)
HDL: 63 mg/dL (ref >50)
LDL Cholesterol (Calc): 108 mg/dL (calc) — ABNORMAL HIGH
NON-HDL CHOLESTEROL (CALC): 128 mg/dL (ref <130)
Triglycerides: 106 mg/dL (ref <150)

## 2018-03-07 LAB — CBC WITH DIFFERENTIAL/PLATELET
BASOS ABS: 9 {cells}/uL (ref 0–200)
BASOS PCT: 0.3 %
Eosinophils Absolute: 90 cells/uL (ref 15–500)
Eosinophils Relative: 3 %
HEMATOCRIT: 35.3 % (ref 35.0–45.0)
HEMOGLOBIN: 11.5 g/dL — AB (ref 11.7–15.5)
Lymphs Abs: 1425 cells/uL (ref 850–3900)
MCH: 28.8 pg (ref 27.0–33.0)
MCHC: 32.6 g/dL (ref 32.0–36.0)
MCV: 88.3 fL (ref 80.0–100.0)
MPV: 10.6 fL (ref 7.5–12.5)
Monocytes Relative: 10 %
NEUTROS ABS: 1176 {cells}/uL — AB (ref 1500–7800)
Neutrophils Relative %: 39.2 %
Platelets: 171 10*3/uL (ref 140–400)
RBC: 4 10*6/uL (ref 3.80–5.10)
RDW: 13.2 % (ref 11.0–15.0)
Total Lymphocyte: 47.5 %
WBC mixed population: 300 cells/uL (ref 200–950)
WBC: 3 10*3/uL — AB (ref 3.8–10.8)

## 2018-03-07 LAB — TSH: TSH: 1.7 mIU/L (ref 0.40–4.50)

## 2018-03-07 LAB — VITAMIN D 25 HYDROXY (VIT D DEFICIENCY, FRACTURES): VIT D 25 HYDROXY: 15 ng/mL — AB (ref 30–100)

## 2018-03-07 MED ORDER — VITAMIN D (ERGOCALCIFEROL) 1.25 MG (50000 UNIT) PO CAPS: 50000.0000 [IU] | ORAL_CAPSULE | ORAL | 0 refills | Status: DC

## 2018-03-07 MED FILL — HYDROCHLOROTHIAZIDE 12.5 MG: 12.5 | 90 days supply | Qty: 90 | Fill #1

## 2018-03-07 NOTE — Addendum Note (Signed)
Addended by: Maryla Morrow on: 03/07/2018 08:57 AM   Modules accepted: Orders

## 2018-03-17 ENCOUNTER — Encounter: Payer: Self-pay | Admitting: Osteopathic Medicine

## 2018-03-19 ENCOUNTER — Ambulatory Visit: Payer: 59

## 2018-03-19 ENCOUNTER — Ambulatory Visit (INDEPENDENT_AMBULATORY_CARE_PROVIDER_SITE_OTHER): Payer: 59

## 2018-03-19 DIAGNOSIS — Z780 Asymptomatic menopausal state: Secondary | ICD-10-CM | POA: Diagnosis not present

## 2018-03-19 DIAGNOSIS — Z13820 Encounter for screening for osteoporosis: Secondary | ICD-10-CM | POA: Diagnosis not present

## 2018-03-27 ENCOUNTER — Ambulatory Visit (INDEPENDENT_AMBULATORY_CARE_PROVIDER_SITE_OTHER): Payer: 59 | Admitting: Osteopathic Medicine

## 2018-03-27 DIAGNOSIS — Z23 Encounter for immunization: Secondary | ICD-10-CM

## 2018-03-31 ENCOUNTER — Other Ambulatory Visit: Payer: Self-pay

## 2018-03-31 ENCOUNTER — Emergency Department (INDEPENDENT_AMBULATORY_CARE_PROVIDER_SITE_OTHER)
Admission: EM | Admit: 2018-03-31 | Discharge: 2018-03-31 | Disposition: A | Discharge status: Home / Self Care | Payer: 59 | Source: Home / Self Care | Attending: Family Medicine | Admitting: Family Medicine

## 2018-03-31 DIAGNOSIS — N3090 Cystitis, unspecified without hematuria: Secondary | ICD-10-CM

## 2018-03-31 DIAGNOSIS — R1030 Lower abdominal pain, unspecified: Secondary | ICD-10-CM | POA: Diagnosis not present

## 2018-03-31 HISTORY — DX: Vitamin D deficiency, unspecified: E55.9

## 2018-03-31 HISTORY — DX: Decreased white blood cell count, unspecified: D72.819

## 2018-03-31 LAB — POCT URINALYSIS DIP (MANUAL ENTRY)
Bilirubin, UA: NEGATIVE
Glucose, UA: NEGATIVE mg/dL
Ketones, POC UA: NEGATIVE mg/dL
Nitrite, UA: NEGATIVE
Protein Ur, POC: NEGATIVE mg/dL
Spec Grav, UA: 1.02 (ref 1.010–1.025)
Urobilinogen, UA: 0.2 E.U./dL
pH, UA: 5 (ref 5.0–8.0)

## 2018-03-31 MED ORDER — CEPHALEXIN 500 MG PO CAPS: 500.0000 mg | ORAL_CAPSULE | Freq: Two times a day (BID) | ORAL | 0 refills | Status: DC

## 2018-03-31 NOTE — Discharge Instructions (Addendum)
Increase fluid intake.  May take Tylenol as needed for pain. 

## 2018-03-31 NOTE — ED Provider Notes (Signed)
[facility]    CSN: [id number] Arrival date & time: 03/31/18  1721     History   Chief Complaint Chief Complaint  Patient presents with  . Abdominal Pain    after urinating    HPI ANDRIA HEAD is a 67 y.o. female.   This morning patient developed lower abdominal pressure sensation and discomfort after urinating without distinct dysuria, hesitancy, or frequency.  She denies pelvic pain or vaginal discharge.  No LMP recorded. Patient is postmenopausal.   No fevers, chills, and sweats.  The history is provided by the patient.    Past Medical History:  Diagnosis Date  . Breast cancer ([place])   . Cancer ([facility])    breast  . Hypertension   . Leucopenia   . Vitamin D deficiency     Patient Active Problem List   Diagnosis Date Noted  . Colon cancer screening 03/25/2017  . Elevated liver enzymes 03/05/2017  . Leucopenia 03/05/2017  . Malignant neoplasm of right breast ([place]) 12/22/2014  . Angioedema 07/16/2014  . Dermatofibroma of deltoid region 09/25/2012  . Elevated blood pressure 05/05/2012  . GERD (gastroesophageal reflux disease) 05/05/2012  . History of breast cancer 05/05/2012    Past Surgical History:  Procedure Laterality Date  . LIMBAL STEM CELL TRANSPLANT    . MASTECTOMY Right   . MODIFIED RADICAL MASTECTOMY Right   . TUBAL LIGATION    . WISDOM TOOTH EXTRACTION      OB History   None      Home Medications    Prior to Admission medications   Medication Sig Start Date End Date Taking? Authorizing Provider  cephALEXin (KEFLEX) 500 MG capsule Take 1 capsule (500 mg total) by mouth 2 (two) times daily. 03/31/18   Kandra Nicolas, MD  hydrochlorothiazide (HYDRODIURIL) 12.5 MG tablet Take 1 tablet (12.5 mg total) by mouth daily. 11/08/17   Emeterio Reeve, DO  Vitamin D, Ergocalciferol, (DRISDOL) 50000 units CAPS capsule Take 1 capsule (50,000 Units total) by mouth every 7 (seven) days. Take for 12 total doses(weeks) 03/07/18   Emeterio Reeve, DO    Family History Family History  Problem Relation Age of Onset  . Heart disease Father   . Alcohol abuse Father   . Hyperlipidemia Father   . Hypertension Father   . Stroke Father   . Prostate cancer Brother   . Diabetes Brother   . Hypertension Brother   . Heart disease Sister     Social History Social History   Tobacco Use  . Smoking status: Never Smoker  . Smokeless tobacco: Never Used  Substance Use Topics  . Alcohol use: No  . Drug use: No     Allergies   Iodinated diagnostic agents; Lisinopril; Thyme; and Iodine   Review of Systems Review of Systems  Constitutional: Negative for chills, diaphoresis, fatigue and fever.  HENT: Negative.   Eyes: Negative.   Respiratory: Negative.   Cardiovascular: Negative.   Gastrointestinal: Positive for abdominal pain. Negative for abdominal distention.  Genitourinary: Negative for difficulty urinating, dysuria, flank pain, frequency, genital sores, hematuria, pelvic pain, urgency, vaginal bleeding, vaginal discharge and vaginal pain.  Musculoskeletal: Negative.   Skin: Negative for rash.  Neurological: Negative for headaches.     Physical Exam Triage Vital Signs ED Triage Vitals [03/31/18 1755]  Enc Vitals Group     BP 137/81     Pulse Rate 60     Resp      Temp 97.6 F (36.4 C)  Temp src      SpO2 100 %     Weight      Height      Head Circumference      Peak Flow      Pain Score 5     Pain Loc      Pain Edu?      Excl. in [place]?    No data found.  Updated Vital Signs BP 137/81 (BP Location: Right Arm)   Pulse 60   Temp 97.6 F (36.4 C)   SpO2 100%   Visual Acuity Right Eye Distance:   Left Eye Distance:   Bilateral Distance:    Right Eye Near:   Left Eye Near:    Bilateral Near:     Physical Exam Nursing notes and Vital Signs reviewed. Appearance:  Patient appears stated age, and in no acute distress.    Eyes:  Pupils are equal, round, and reactive to light and  accomodation.  Extraocular movement is intact.  Conjunctivae are not inflamed   Pharynx:  Normal; moist mucous membranes  Neck:  Supple.  No adenopathy Lungs:  Clear to auscultation.  Breath sounds are equal.  Moving air well. Heart:  Regular rate and rhythm without murmurs, rubs, or gallops.  Abdomen:  Nontender without masses or hepatosplenomegaly.  Bowel sounds are present.  No CVA or flank tenderness.  Extremities:  No edema.  Skin:  No rash present.     UC Treatments / Results  Labs (all labs ordered are listed, but only abnormal results are displayed) Labs Reviewed  POCT URINALYSIS DIP (MANUAL ENTRY) - Abnormal; Notable for the following components:      Result Value   Clarity, UA cloudy (*)    Blood, UA moderate (*)    Leukocytes, UA Moderate (2+) (*)    All other components within normal limits  URINE CULTURE    EKG None  Radiology No results found.  Procedures Procedures (including critical care time)  Medications Ordered in UC Medications - No data to display  Initial Impression / Assessment and Plan / UC Course  I have reviewed the triage vital signs and the nursing notes.  Pertinent labs & imaging results that were available during my care of the patient were reviewed by me and considered in my medical decision making (see chart for details).    Symptoms most consistent with cystitis.  Urine culture pending. Begin Keflex. Followup with Family Doctor if not improved in one week.    Final Clinical Impressions(s) / UC Diagnoses   Final diagnoses:  Lower abdominal pain  Cystitis     Discharge Instructions     Increase fluid intake.  May take Tylenol as needed for pain.    ED Prescriptions    Medication Sig Dispense Auth. Provider   cephALEXin (KEFLEX) 500 MG capsule Take 1 capsule (500 mg total) by mouth 2 (two) times daily. 14 capsule Kandra Nicolas, MD        Kandra Nicolas, MD 04/02/18 Drema Halon

## 2018-03-31 NOTE — ED Triage Notes (Signed)
Pt states that she has abdominal pain after urinating.

## 2018-04-03 ENCOUNTER — Telehealth: Payer: Self-pay

## 2018-04-03 LAB — URINE CULTURE
MICRO NUMBER:: 91140557
SPECIMEN QUALITY:: ADEQUATE

## 2018-04-03 NOTE — Telephone Encounter (Signed)
Pt notified of ucx results. Advised to finish all meds and call if any concerns.

## 2018-04-14 ENCOUNTER — Encounter: Payer: Self-pay | Admitting: Osteopathic Medicine

## 2018-04-14 MED ORDER — VITAMIN D (ERGOCALCIFEROL) 1.25 MG (50000 UNIT) PO CAPS: 50000.0000 [IU] | ORAL_CAPSULE | ORAL | 0 refills | Status: DC

## 2018-04-14 MED FILL — VIT D2 1.25 MG (50,000 UNIT: 1.25 MG | 56 days supply | Qty: 8 | Fill #0

## 2018-06-25 ENCOUNTER — Other Ambulatory Visit: Payer: Self-pay | Admitting: Osteopathic Medicine

## 2018-06-25 NOTE — Telephone Encounter (Signed)
Please review for refill- patient at [facility] 

## 2018-06-26 MED FILL — HYDROCHLOROTHIAZIDE 12.5 MG: 12.5 | 90 days supply | Qty: 90 | Fill #0

## 2018-07-29 ENCOUNTER — Encounter: Payer: Self-pay | Admitting: Osteopathic Medicine

## 2018-07-30 ENCOUNTER — Encounter: Payer: Self-pay | Admitting: Osteopathic Medicine

## 2018-07-30 ENCOUNTER — Ambulatory Visit (INDEPENDENT_AMBULATORY_CARE_PROVIDER_SITE_OTHER): Payer: 59 | Admitting: Osteopathic Medicine

## 2018-07-30 VITALS — BP 135/84 | HR 69 | Temp 98.0°F | Wt 180.0 lb

## 2018-07-30 DIAGNOSIS — L739 Follicular disorder, unspecified: Secondary | ICD-10-CM | POA: Diagnosis not present

## 2018-07-30 DIAGNOSIS — L509 Urticaria, unspecified: Secondary | ICD-10-CM

## 2018-07-30 NOTE — Progress Notes (Signed)
HPI: Sabrina Abbott is a 68 y.o. female who  has a past medical history of Breast cancer ([place]), Cancer ([place]), Hypertension, Leucopenia, and Vitamin D deficiency.  she presents to [facility] today, 07/30/18,  for chief complaint of:  Skin concern near vagina/groin  . Location: L mons/labia majora . Quality: bump, nonpainful, no drainage . Severity: better today, decreasing in size . Duration: first noted 2 days ago  . Modifying factors: nothing makes better/worse . NO vaginal discharge/bleeding or localized itching . Reports no need for STI screening . Whole-body itching also bothering her over the past couple months, worried that scratching may have caused this.      At today's visit 07/30/18 ... PMH, PSH, FH reviewed and updated as needed.  Current medication list and allergy/intolerance hx reviewed and updated as needed. (See remainder of HPI, ROS, Phys Exam below)          ASSESSMENT/PLAN: The primary encounter diagnosis was Folliculitis. A diagnosis of Urticaria was also pertinent to this visit.  Seems less likely lipoma though this would be possible but given its recent occurrence and apparently beginning to resolve at this point, seems more likely a slightly enlarged lymph node/mild folliculitis that is resolving on its own.  I do not think that an incision/drainage is warranted at this point given that there is no pain or erythema.  There does not appear to be any cellulitis.  I think we can probably keep an eye on this and see if it needs to be lanced eventually.    Patient reports more longstanding issue of itching, she was going to just bring it up when she saw me next month for blood pressure follow-up.  Has not tried any over-the-counter medications, no new exposures to allergens/irritants.  Advise trial antihistamines over-the-counter and consider dermatology referral, see patient instructions.  Orders Placed This Encounter   Procedures  . CBC with Differential/Platelet  . COMPLETE METABOLIC PANEL WITH GFR       Patient Instructions  I think this may be a mild subcutaneous infection that has already started to resolve itself, or possibly an enlargement of a lymph node from local skin irritation which is also starting to resolve.  I would just leave the lump alone other than warm compresses for 10 to 15 minutes at a time, at least 2-3 times per day.  If it starts to look more red/painful or like it needs to be lanced, please come see me in the office and we can take care of this. (If you call to make an appointment and they say I am booked, have them ask me, I am sure we can fit you in somewhere if this needs to be taken care of.)   For the itching, recommend try over-the-counter Allegra, Zyrtec, Claritin, or any of their generic equivalents.  Try this for a couple of days/weeks and see if this helps.  If not, may consider sending you to a dermatologist.    We will also get some blood work to take a look and make sure the white blood cells and liver/kidney function are okay.         Follow-up plan: Return if symptoms worsen or fail to improve.                                                 ################################################# ################################################# ################################################# #################################################  Current Meds  Medication Sig  . hydrochlorothiazide (HYDRODIURIL) 12.5 MG tablet TAKE 1 TABLET (12.5 MG TOTAL) BY MOUTH DAILY.    Allergies  Allergen Reactions  . Iodinated Diagnostic Agents Other (See Comments)    Made one side of her "right side of body hurt real bad" Made one side of her "right side of body hurt real bad"  . Lisinopril Other (See Comments)  . Thyme     Patient states that allergist said she was not allergic to thyme  . Iodine         Review of Systems:  Constitutional: No recent illness  HEENT: No  headache, no vision change  Cardiac: No  chest pain, No  pressure, No palpitations  Respiratory:  No  shortness of breath. No  Cough  Gastrointestinal: No  abdominal pain, no change on bowel habits  Musculoskeletal: No new myalgia/arthralgia  Skin: + skin concern, no Rash - see HPI  GU: No unusual vaginal discharge or bleeding.  Hem/Onc: No  easy bruising/bleeding, +abnormal lumps/bumps  Neurologic: No  weakness, No  Dizziness   Exam:  BP (!) 143/71 (BP Location: Left Arm, Patient Position: Sitting, Cuff Size: Normal)   Pulse 69   Temp 98 F (36.7 C) (Oral)   Wt 180 lb (81.6 kg)   BMI 27.37 kg/m   Constitutional: VS see above. General Appearance: alert, well-developed, well-nourished, NAD  Eyes: Normal lids and conjunctive, non-icteric sclera  Ears, Nose, Mouth, Throat: MMM, Normal external inspection ears/nares/mouth/lips/gums.  Neck: No masses, trachea midline.   Respiratory: Normal respiratory effort.   Musculoskeletal: Gait normal. Symmetric and independent movement of all extremities  Neurological: Normal balance/coordination. No tremor.  Skin: warm, dry, intact.  Small induration/subcutaneous nodule left labia majora/mons pubis area, no enlarged lymph nodes palpable in the groin, external vagina appears normal.  Area is non-erythematous, nontender.  Psychiatric: Normal judgment/insight. Normal mood and affect. Oriented x3.       Visit summary with medication list and pertinent instructions was printed for patient to review, patient was advised to alert [place] if any updates are needed. All questions at time of visit were answered - patient instructed to contact office with any additional concerns. ER/RTC precautions were reviewed with the patient and understanding verbalized.   Please note: voice recognition software was used to produce this document, and typos may escape review. Please  contact Dr. Sheppard Coil for any needed clarifications.    Follow up plan: Return if symptoms worsen or fail to improve.

## 2018-07-30 NOTE — Patient Instructions (Addendum)
I think this may be a mild subcutaneous infection that has already started to resolve itself, or possibly an enlargement of a lymph node from local skin irritation which is also starting to resolve.  I would just leave the lump alone other than warm compresses for 10 to 15 minutes at a time, at least 2-3 times per day.  If it starts to look more red/painful or like it needs to be lanced, please come see me in the office and we can take care of this. (If you call to make an appointment and they say I am booked, have them ask me, I am sure we can fit you in somewhere if this needs to be taken care of.)   For the itching, recommend try over-the-counter Allegra, Zyrtec, Claritin, or any of their generic equivalents.  Try this for a couple of days/weeks and see if this helps.  If not, may consider sending you to a dermatologist.    We will also get some blood work to take a look and make sure the white blood cells and liver/kidney function are okay.

## 2018-07-31 LAB — CBC WITH DIFFERENTIAL/PLATELET
Absolute Monocytes: 381 cells/uL (ref 200–950)
BASOS PCT: 0.3 %
Basophils Absolute: 11 cells/uL (ref 0–200)
EOS ABS: 59 {cells}/uL (ref 15–500)
EOS PCT: 1.6 %
HCT: 37 % (ref 35.0–45.0)
HEMOGLOBIN: 12.1 g/dL (ref 11.7–15.5)
Lymphs Abs: 1328 cells/uL (ref 850–3900)
MCH: 28.7 pg (ref 27.0–33.0)
MCHC: 32.7 g/dL (ref 32.0–36.0)
MCV: 87.7 fL (ref 80.0–100.0)
MONOS PCT: 10.3 %
MPV: 10.3 fL (ref 7.5–12.5)
NEUTROS ABS: 1920 {cells}/uL (ref 1500–7800)
Neutrophils Relative %: 51.9 %
PLATELETS: 181 10*3/uL (ref 140–400)
RBC: 4.22 10*6/uL (ref 3.80–5.10)
RDW: 13.4 % (ref 11.0–15.0)
TOTAL LYMPHOCYTE: 35.9 %
WBC: 3.7 10*3/uL — ABNORMAL LOW (ref 3.8–10.8)

## 2018-07-31 LAB — COMPLETE METABOLIC PANEL WITH GFR
AG RATIO: 1.4 (calc) (ref 1.0–2.5)
ALKALINE PHOSPHATASE (APISO): 71 U/L (ref 33–130)
ALT: 22 U/L (ref 6–29)
AST: 23 U/L (ref 10–35)
Albumin: 4.1 g/dL (ref 3.6–5.1)
BILIRUBIN TOTAL: 0.3 mg/dL (ref 0.2–1.2)
BUN/Creatinine Ratio: 24 (calc) — ABNORMAL HIGH (ref 6–22)
BUN: 27 mg/dL — AB (ref 7–25)
CHLORIDE: 105 mmol/L (ref 98–110)
CO2: 30 mmol/L (ref 20–32)
Calcium: 9.4 mg/dL (ref 8.6–10.4)
Creat: 1.12 mg/dL — ABNORMAL HIGH (ref 0.50–0.99)
GFR, Est African American: 59 mL/min/{1.73_m2} — ABNORMAL LOW (ref >=60)
GFR, Est Non African American: 51 mL/min/{1.73_m2} — ABNORMAL LOW (ref >=60)
GLUCOSE: 76 mg/dL (ref 65–99)
Globulin: 2.9 g/dL (calc) (ref 1.9–3.7)
POTASSIUM: 4.4 mmol/L (ref 3.5–5.3)
Sodium: 140 mmol/L (ref 135–146)
Total Protein: 7 g/dL (ref 6.1–8.1)

## 2018-09-04 ENCOUNTER — Ambulatory Visit: Payer: 59 | Admitting: Osteopathic Medicine

## 2018-09-19 ENCOUNTER — Other Ambulatory Visit: Payer: Self-pay | Admitting: Osteopathic Medicine

## 2018-09-19 MED FILL — HYDROCHLOROTHIAZIDE 12.5 MG: 12.5 | 90 days supply | Qty: 90 | Fill #0

## 2018-10-22 ENCOUNTER — Encounter: Payer: Self-pay | Admitting: Osteopathic Medicine

## 2018-12-04 ENCOUNTER — Encounter: Payer: Self-pay | Admitting: Osteopathic Medicine

## 2018-12-09 ENCOUNTER — Ambulatory Visit (INDEPENDENT_AMBULATORY_CARE_PROVIDER_SITE_OTHER): Payer: 59 | Admitting: Osteopathic Medicine

## 2018-12-09 ENCOUNTER — Encounter: Payer: Self-pay | Admitting: Osteopathic Medicine

## 2018-12-09 VITALS — BP 134/76 | HR 56 | Temp 97.7°F | Wt 186.4 lb

## 2018-12-09 DIAGNOSIS — K2100 Gastro-esophageal reflux disease with esophagitis, without bleeding: Secondary | ICD-10-CM

## 2018-12-09 DIAGNOSIS — R140 Abdominal distension (gaseous): Secondary | ICD-10-CM | POA: Diagnosis not present

## 2018-12-09 DIAGNOSIS — H6982 Other specified disorders of Eustachian tube, left ear: Secondary | ICD-10-CM | POA: Diagnosis not present

## 2018-12-09 MED ORDER — RABEPRAZOLE SODIUM 20 MG PO TBEC: 20.0000 mg | DELAYED_RELEASE_TABLET | Freq: Every day | ORAL | 0 refills | Status: DC

## 2018-12-09 MED FILL — RABEPRAZOLE SOD DR 20 MG TA: 20 | 90 days supply | Qty: 90 | Fill #0

## 2018-12-09 NOTE — Progress Notes (Signed)
HPI: Sabrina Abbott is a 68 y.o. female who  has a past medical history of Breast cancer ([place]), Cancer ([place]), Hypertension, Leucopenia, and Vitamin D deficiency.  she presents to [facility] today, 12/09/18,  for chief complaint of:  GI problem - concern for acid reflux  . Context: history GERD, was on Aciphex years ago w/ good success . Quality/Location: epigastric/chest bloating, feels like something is stuck . Timing: intermittent . Modifying factors: improved w/ Maalox, worse w/ certain foods, activity/exercise has no effect  . Assoc signs/symptoms: ear fullness/pain      At today's visit 12/09/18 ... PMH, PSH, FH reviewed and updated as needed.  Current medication list and allergy/intolerance hx reviewed and updated as needed. (See remainder of HPI, ROS, Phys Exam below)   No results found.  No results found for this or any previous visit (from the past 72 hour(s)).        ASSESSMENT/PLAN: The primary encounter diagnosis was Bloating symptom. Diagnoses of Gastroesophageal reflux disease with esophagitis and Eustachian tube dysfunction, left were also pertinent to this visit.    Meds ordered this encounter  Medications  . RABEprazole (ACIPHEX) 20 MG tablet    Sig: Take 1 tablet (20 mg total) by mouth daily.    Dispense:  90 tablet    Refill:  0    Patient Instructions  I think your symptoms are most likely related to gas/acid reflux.  Other possibilities would include something like scar tissue in the esophagus called a stricture, or a hiatal hernia which is where the stomach can slide up through the diaphragm and into the chest -both of these can give a feeling of something being stuck in the chest especially with swallowing.  Both of these conditions can be bothersome but are not dangerous.    If the acid medication is not helpful, we should send you to a GI specialist to consider looking down the esophagus into the stomach with  a camera for further evaluation/treatment.    The ear symptoms may also improve with treatment of the acid reflux.  Could also try 1 to 2 weeks of Flonase nasal spray, or treatment with oral antihistamine such as Allegra/Claritin/Zyrtec or their generic equivalents.  If you develop significant pain, especially if nausea/vomiting, vomiting anything that looks like blood or coffee grounds, please seek emergency medical attention immediately!        Follow-up plan: Return if symptoms worsen or fail to improve.                                                 ################################################# ################################################# ################################################# #################################################    No outpatient medications have been marked as taking for the 12/09/18 encounter (Office Visit) with Emeterio Reeve, DO.    Allergies  Allergen Reactions  . Iodinated Diagnostic Agents Other (See Comments)    Made one side of her "right side of body hurt real bad"   . Lisinopril Other (See Comments)  . Thyme Other (See Comments)    Patient states that allergist said she was not allergic to thyme   . Iodine        Review of Systems:  Constitutional: No recent illness  HEENT: No  headache, no vision change  Cardiac: No  chest pain, No  pressure, No palpitations  Respiratory:  No  shortness of  breath. No  Cough  Gastrointestinal: No  abdominal pain, no change on bowel habits  Musculoskeletal: No new myalgia/arthralgia  Skin: No  Rash  Psychiatric: No  concerns with depression, No  concerns with anxiety  Exam:  BP 134/76 (BP Location: Left Arm, Patient Position: Sitting, Cuff Size: Normal)   Pulse (!) 56   Temp 97.7 F (36.5 C) (Oral)   Wt 186 lb 6.4 oz (84.6 kg)   BMI 28.34 kg/m   Constitutional: VS see above. General Appearance: alert, well-developed,  well-nourished, NAD  Eyes: Normal lids and conjunctive, non-icteric sclera  Ears, Nose, Mouth, Throat: MMM, Normal external inspection ears/nares/mouth/lips/gums.  Neck: No masses, trachea midline.   Respiratory: Normal respiratory effort. no wheeze, no rhonchi, no rales  Cardiovascular: S1/S2 normal, no murmur, no rub/gallop auscultated. RRR.   Musculoskeletal: Gait normal. Symmetric and independent movement of all extremities  Abdominal: non-tender, non-distended, no appreciable organomegaly, neg Murphy's, BS WNLx4  Neurological: Normal balance/coordination. No tremor.  Skin: warm, dry, intact.   Psychiatric: Normal judgment/insight. Normal mood and affect. Oriented x3.       Visit summary with medication list and pertinent instructions was printed for patient to review, patient was advised to alert [place] if any updates are needed. All questions at time of visit were answered - patient instructed to contact office with any additional concerns. ER/RTC precautions were reviewed with the patient and understanding verbalized.    Please note: voice recognition software was used to produce this document, and typos may escape review. Please contact Dr. Sheppard Coil for any needed clarifications.    Follow up plan: Return if symptoms worsen or fail to improve.

## 2018-12-09 NOTE — Patient Instructions (Addendum)
I think your symptoms are most likely related to gas/acid reflux.  Other possibilities would include something like scar tissue in the esophagus called a stricture, or a hiatal hernia which is where the stomach can slide up through the diaphragm and into the chest -both of these can give a feeling of something being stuck in the chest especially with swallowing.  Both of these conditions can be bothersome but are not dangerous.    If the acid medication is not helpful, we should send you to a GI specialist to consider looking down the esophagus into the stomach with a camera for further evaluation/treatment.    The ear symptoms may also improve with treatment of the acid reflux.  Could also try 1 to 2 weeks of Flonase nasal spray, or treatment with oral antihistamine such as Allegra/Claritin/Zyrtec or their generic equivalents.  If you develop significant pain, especially if nausea/vomiting, vomiting anything that looks like blood or coffee grounds, please seek emergency medical attention immediately!         Food Choices for Gastroesophageal Reflux Disease, Adult When you have gastroesophageal reflux disease (GERD), the foods you eat and your eating habits are very important. Choosing the right foods can help ease the discomfort of GERD. Consider working with a diet and nutrition specialist (dietitian) to help you make healthy food choices. What general guidelines should I follow?  Eating plan  Choose healthy foods low in fat, such as fruits, vegetables, whole grains, low-fat dairy products, and lean meat, fish, and poultry.  Eat frequent, small meals instead of three large meals each day. Eat your meals slowly, in a relaxed setting. Avoid bending over or lying down until 2-3 hours after eating.  Limit high-fat foods such as fatty meats or fried foods.  Limit your intake of oils, butter, and shortening to less than 8 teaspoons each day.  Avoid the following: ? Foods that cause symptoms.  These may be different for different people. Keep a food diary to keep track of foods that cause symptoms. ? Alcohol. ? Drinking large amounts of liquid with meals. ? Eating meals during the 2-3 hours before bed.  Cook foods using methods other than frying. This may include baking, grilling, or broiling. Lifestyle  Maintain a healthy weight. Ask your health care provider what weight is healthy for you. If you need to lose weight, work with your health care provider to do so safely.  Exercise for at least 30 minutes on 5 or more days each week, or as told by your health care provider.  Avoid wearing clothes that fit tightly around your waist and chest.  Do not use any products that contain nicotine or tobacco, such as cigarettes and e-cigarettes. If you need help quitting, ask your health care provider.  Sleep with the head of your bed raised. Use a wedge under the mattress or blocks under the bed frame to raise the head of the bed. What foods are not recommended? The items listed may not be a complete list. Talk with your dietitian about what dietary choices are best for you. Grains Pastries or quick breads with added fat. [place] toast. Vegetables Deep fried vegetables. [place] fries. Any vegetables prepared with added fat. Any vegetables that cause symptoms. For some people this may include tomatoes and tomato products, chili peppers, onions and garlic, and horseradish. Fruits Any fruits prepared with added fat. Any fruits that cause symptoms. For some people this may include citrus fruits, such as oranges, grapefruit, pineapple, and lemons.  Meats and other protein foods High-fat meats, such as fatty beef or pork, hot dogs, ribs, ham, sausage, salami and bacon. Fried meat or protein, including fried fish and fried chicken. Nuts and nut butters. Dairy Whole milk and chocolate milk. Sour cream. Cream. Ice cream. Cream cheese. Milk shakes. Beverages Coffee and tea, with or without  caffeine. Carbonated beverages. Sodas. Energy drinks. Fruit juice made with acidic fruits (such as orange or grapefruit). Tomato juice. Alcoholic drinks. Fats and oils Butter. Margarine. Shortening. Ghee. Sweets and desserts Chocolate and cocoa. Donuts. Seasoning and other foods Pepper. Peppermint and spearmint. Any condiments, herbs, or seasonings that cause symptoms. For some people, this may include curry, hot sauce, or vinegar-based salad dressings. Summary  When you have gastroesophageal reflux disease (GERD), food and lifestyle choices are very important to help ease the discomfort of GERD.  Eat frequent, small meals instead of three large meals each day. Eat your meals slowly, in a relaxed setting. Avoid bending over or lying down until 2-3 hours after eating.  Limit high-fat foods such as fatty meat or fried foods. This information is not intended to replace advice given to you by your health care provider. Make sure you discuss any questions you have with your health care provider. Document Released: 06/25/2005 Document Revised: 06/26/2016 Document Reviewed: 06/26/2016 Elsevier Interactive Patient Education  2019 [employer].

## 2018-12-22 ENCOUNTER — Other Ambulatory Visit: Payer: Self-pay | Admitting: Osteopathic Medicine

## 2018-12-22 ENCOUNTER — Encounter: Payer: Self-pay | Admitting: Osteopathic Medicine

## 2018-12-22 MED FILL — HYDROCHLOROTHIAZIDE 12.5 MG: 12.5 | 90 days supply | Qty: 90 | Fill #0

## 2019-01-14 ENCOUNTER — Encounter: Payer: Self-pay | Admitting: Osteopathic Medicine

## 2019-01-14 NOTE — Telephone Encounter (Signed)
Please call pt to reschedule.

## 2019-02-02 ENCOUNTER — Other Ambulatory Visit: Payer: Self-pay | Admitting: Osteopathic Medicine

## 2019-02-02 DIAGNOSIS — Z1231 Encounter for screening mammogram for malignant neoplasm of breast: Secondary | ICD-10-CM

## 2019-02-18 DIAGNOSIS — Z719 Counseling, unspecified: Secondary | ICD-10-CM | POA: Diagnosis not present

## 2019-02-18 DIAGNOSIS — Z20828 Contact with and (suspected) exposure to other viral communicable diseases: Secondary | ICD-10-CM | POA: Diagnosis not present

## 2019-02-24 ENCOUNTER — Encounter: Payer: 59 | Admitting: Osteopathic Medicine

## 2019-02-26 ENCOUNTER — Encounter: Payer: 59 | Admitting: Osteopathic Medicine

## 2019-03-05 ENCOUNTER — Encounter: Payer: 59 | Admitting: Osteopathic Medicine

## 2019-03-10 ENCOUNTER — Encounter: Payer: Self-pay | Admitting: Osteopathic Medicine

## 2019-03-10 ENCOUNTER — Ambulatory Visit (INDEPENDENT_AMBULATORY_CARE_PROVIDER_SITE_OTHER): Payer: 59 | Admitting: Osteopathic Medicine

## 2019-03-10 ENCOUNTER — Other Ambulatory Visit: Payer: Self-pay

## 2019-03-10 VITALS — BP 131/74 | HR 62 | Temp 97.8°F | Wt 184.6 lb

## 2019-03-10 DIAGNOSIS — Z23 Encounter for immunization: Secondary | ICD-10-CM | POA: Diagnosis not present

## 2019-03-10 DIAGNOSIS — Z0000 Encounter for general adult medical examination without abnormal findings: Secondary | ICD-10-CM

## 2019-03-10 DIAGNOSIS — K2100 Gastro-esophageal reflux disease with esophagitis, without bleeding: Secondary | ICD-10-CM

## 2019-03-10 DIAGNOSIS — I10 Essential (primary) hypertension: Secondary | ICD-10-CM | POA: Diagnosis not present

## 2019-03-10 MED ORDER — HYDROCHLOROTHIAZIDE 12.5 MG PO TABS: 12.5000 mg | ORAL_TABLET | Freq: Every day | ORAL | 3 refills | Status: DC

## 2019-03-10 MED FILL — HYDROCHLOROTHIAZIDE 12.5 MG: 12.5 | 90 days supply | Qty: 90 | Fill #0

## 2019-03-10 NOTE — Patient Instructions (Addendum)
General Preventive Care  Most recent routine screening lipids/other labs: ordered    Tobacco: don't!   Alcohol: responsible moderation is ok for most adults - if you have concerns about your alcohol intake, please talk to me!   Exercise: as tolerated to reduce risk of cardiovascular disease and diabetes. Strength training will also prevent osteoporosis.   Mental health: if need for mental health care (medicines, counseling, other), or concerns about moods, please let me know!   Sexual health: if need for STD testing, or if concerns with libido/pain problems, please let me know!  Advanced Directive: Living Will and/or Healthcare Power of Attorney recommended for all adults, regardless of age or health.  Vaccines  Flu vaccine: recommended for almost everyone, every fall.   Shingles vaccine: Shingrix (2 doses) recommended after age 31. Some folks have had the older Zostavax (one dose)  Pneumonia vaccines: all done   Tetanus booster: Tdap recommended every 10 years.   Cancer screenings   Colon cancer screening: recommended for everyone at age 2, but some folks need a colonoscopy sooner if risk factors   Breast cancer screening: mammogram scheduled.   Cervical cancer screening: Pap every 1 to 5 years depending on age and other risk factors. Can stop Pap at age 44 or w/ hysterectomy if previous 2 screenings were normal.   Lung cancer screening: not needed for non-smokers  Infection screenings . HIV, Gonorrhea/Chlamydia: screening as needed.  . Hepatitis C: recommended for anyone born 47-1964 . TB: certain at-risk populations, or depending on work requirements and/or travel history Other . Bone Density Test: recommended for women at age 59, every other year (due next year 2021)

## 2019-03-10 NOTE — Progress Notes (Signed)
HPI: Sabrina Abbott is a 68 y.o. female who  has a past medical history of Breast cancer ([place]), Cancer ([place]), Hypertension, Leucopenia, and Vitamin D deficiency.  she presents to [facility] today, 03/10/19,  for chief complaint of: Annual physical    Patient here for annual physical / wellness exam.  See preventive care reviewed as below.  Recent labs reviewed in detail with the patient.   Additional concerns today include:  None      Past medical, surgical, social and family history reviewed:  Patient Active Problem List   Diagnosis Date Noted  . Colon cancer screening 03/25/2017  . Elevated liver enzymes 03/05/2017  . Leucopenia 03/05/2017  . Malignant neoplasm of right breast ([place]) 12/22/2014  . Angioedema 07/16/2014  . Dermatofibroma of deltoid region 09/25/2012  . Elevated blood pressure 05/05/2012  . GERD (gastroesophageal reflux disease) 05/05/2012  . History of breast cancer 05/05/2012    Past Surgical History:  Procedure Laterality Date  . LIMBAL STEM CELL TRANSPLANT    . MASTECTOMY Right   . MODIFIED RADICAL MASTECTOMY Right   . TUBAL LIGATION    . WISDOM TOOTH EXTRACTION      Social History   Tobacco Use  . Smoking status: Never Smoker  . Smokeless tobacco: Never Used  Substance Use Topics  . Alcohol use: No    Family History  Problem Relation Age of Onset  . Heart disease Father   . Alcohol abuse Father   . Hyperlipidemia Father   . Hypertension Father   . Stroke Father   . Prostate cancer Brother   . Diabetes Brother   . Hypertension Brother   . Heart disease Sister      Current medication list and allergy/intolerance information reviewed:    Current Outpatient Medications  Medication Sig Dispense Refill  . hydrochlorothiazide (HYDRODIURIL) 12.5 MG tablet Take 1 tablet (12.5 mg total) by mouth daily. 90 tablet 0  . RABEprazole (ACIPHEX) 20 MG tablet Take 1 tablet (20 mg total) by mouth daily. 90  tablet 0   No current facility-administered medications for this visit.     Allergies  Allergen Reactions  . Iodinated Diagnostic Agents Other (See Comments)    Made one side of her "right side of body hurt real bad"   . Lisinopril Other (See Comments)  . Thyme Other (See Comments)    Patient states that allergist said she was not allergic to thyme   . Iodine       Review of Systems:  Constitutional:  No  fever, no chills, No recent illness, No unintentional weight changes. No significant fatigue.   HEENT: No  headache, no vision change, no hearing change, No sore throat, No  sinus pressure  Cardiac: No  chest pain, No  pressure, No palpitations, No  Orthopnea  Respiratory:  No  shortness of breath. No  Cough  Gastrointestinal: No  abdominal pain, No  nausea, No  vomiting,  No  blood in stool, No  diarrhea, No  constipation   Musculoskeletal: No new myalgia/arthralgia  Skin: No  Rash, No other wounds/concerning lesions  Genitourinary: No  incontinence, No  abnormal genital bleeding, No abnormal genital discharge  Hem/Onc: No  easy bruising/bleeding  Endocrine: No cold intolerance,  No heat intolerance. No polyuria/polydipsia/polyphagia   Neurologic: No  weakness, No  dizziness, No  slurred speech/focal weakness/facial droop  Psychiatric: No  concerns with depression, No  concerns with anxiety, No sleep problems, No mood problems  Exam:  BP 131/74 (BP Location: Left Arm, Patient Position: Sitting, Cuff Size: Normal)   Pulse 62   Temp 97.8 F (36.6 C) (Oral)   Wt 184 lb 9.6 oz (83.7 kg)   BMI 28.07 kg/m   Constitutional: VS see above. General Appearance: alert, well-developed, well-nourished, NAD  Eyes: Normal lids and conjunctive, non-icteric sclera  Neck: No masses, trachea midline. No thyroid enlargement. No tenderness/mass appreciated. No lymphadenopathy  Respiratory: Normal respiratory effort. no wheeze, no rhonchi, no rales  Cardiovascular: S1/S2  normal, no murmur, no rub/gallop auscultated. RRR. No lower extremity edema.   Gastrointestinal: Nontender, no masses. No hepatomegaly, no splenomegaly. No hernia appreciated. Bowel sounds normal. Rectal exam deferred.   Musculoskeletal: Gait normal. No clubbing/cyanosis of digits.   Neurological: Normal balance/coordination. No tremor. No cranial nerve deficit on limited exam. Motor and sensation intact and symmetric. Cerebellar reflexes intact.   Skin: warm, dry, intact. No rash/ulcer. No concerning nevi or subq nodules on limited exam.    Psychiatric: Normal judgment/insight. Normal mood and affect. Oriented x3.    No results found for this or any previous visit (from the past 72 hour(s)).  No results found.   ASSESSMENT/PLAN: The primary encounter diagnosis was Annual physical exam. Diagnoses of Need for influenza vaccination, Essential hypertension, and Gastroesophageal reflux disease with esophagitis were also pertinent to this visit.   Orders Placed This Encounter  Procedures  . Flu Vaccine QUAD High Dose(Fluad)  . CBC  . COMPLETE METABOLIC PANEL WITH GFR  . Lipid panel  . TSH    Meds ordered this encounter  Medications  . hydrochlorothiazide (HYDRODIURIL) 12.5 MG tablet    Sig: Take 1 tablet (12.5 mg total) by mouth daily.    Dispense:  90 tablet    Refill:  3    Patient Instructions  General Preventive Care  Most recent routine screening lipids/other labs: ordered    Tobacco: don't!   Alcohol: responsible moderation is ok for most adults - if you have concerns about your alcohol intake, please talk to me!   Exercise: as tolerated to reduce risk of cardiovascular disease and diabetes. Strength training will also prevent osteoporosis.   Mental health: if need for mental health care (medicines, counseling, other), or concerns about moods, please let me know!   Sexual health: if need for STD testing, or if concerns with libido/pain problems, please let me  know!  Advanced Directive: Living Will and/or Healthcare Power of Attorney recommended for all adults, regardless of age or health.  Vaccines  Flu vaccine: recommended for almost everyone, every fall.   Shingles vaccine: Shingrix (2 doses) recommended after age 51. Some folks have had the older Zostavax (one dose)  Pneumonia vaccines: all done   Tetanus booster: Tdap recommended every 10 years.   Cancer screenings   Colon cancer screening: recommended for everyone at age 12, but some folks need a colonoscopy sooner if risk factors   Breast cancer screening: mammogram scheduled.   Cervical cancer screening: Pap every 1 to 5 years depending on age and other risk factors. Can stop Pap at age 38 or w/ hysterectomy if previous 2 screenings were normal.   Lung cancer screening: not needed for non-smokers  Infection screenings . HIV, Gonorrhea/Chlamydia: screening as needed.  . Hepatitis C: recommended for anyone born 29-1964 . TB: certain at-risk populations, or depending on work requirements and/or travel history Other . Bone Density Test: recommended for women at age 48, every other year (due next year 2021)  Visit summary with medication list and pertinent instructions was printed for patient to review. All questions at time of visit were answered - patient instructed to contact office with any additional concerns or updates.    Please note: voice recognition software was used to produce this document, and typos may escape review. Please contact Dr. Sheppard Coil for any needed clarifications.     Follow-up plan: Return in about 1 year (around 03/09/2020) for [place] (call week prior to visit for lab orders).

## 2019-03-11 ENCOUNTER — Encounter: Payer: Self-pay | Admitting: Osteopathic Medicine

## 2019-03-11 LAB — COMPLETE METABOLIC PANEL WITH GFR
AG Ratio: 1.4 (calc) (ref 1.0–2.5)
ALT: 17 U/L (ref 6–29)
AST: 24 U/L (ref 10–35)
Albumin: 4.2 g/dL (ref 3.6–5.1)
Alkaline phosphatase (APISO): 61 U/L (ref 37–153)
BUN/Creatinine Ratio: 22 (calc) (ref 6–22)
BUN: 24 mg/dL (ref 7–25)
CO2: 30 mmol/L (ref 20–32)
Calcium: 9.7 mg/dL (ref 8.6–10.4)
Chloride: 105 mmol/L (ref 98–110)
Creat: 1.07 mg/dL — ABNORMAL HIGH (ref 0.50–0.99)
GFR, Est African American: 63 mL/min/{1.73_m2} (ref >=60)
GFR, Est Non African American: 54 mL/min/{1.73_m2} — ABNORMAL LOW (ref >=60)
Globulin: 2.9 g/dL (calc) (ref 1.9–3.7)
Glucose, Bld: 81 mg/dL (ref 65–99)
Potassium: 4.3 mmol/L (ref 3.5–5.3)
Sodium: 139 mmol/L (ref 135–146)
Total Bilirubin: 0.3 mg/dL (ref 0.2–1.2)
Total Protein: 7.1 g/dL (ref 6.1–8.1)

## 2019-03-11 LAB — CBC
HCT: 37.9 % (ref 35.0–45.0)
Hemoglobin: 12 g/dL (ref 11.7–15.5)
MCH: 28.4 pg (ref 27.0–33.0)
MCHC: 31.7 g/dL — ABNORMAL LOW (ref 32.0–36.0)
MCV: 89.8 fL (ref 80.0–100.0)
MPV: 10.8 fL (ref 7.5–12.5)
Platelets: 192 10*3/uL (ref 140–400)
RBC: 4.22 10*6/uL (ref 3.80–5.10)
RDW: 14 % (ref 11.0–15.0)
WBC: 2.8 10*3/uL — ABNORMAL LOW (ref 3.8–10.8)

## 2019-03-11 LAB — LIPID PANEL
Cholesterol: 213 mg/dL — ABNORMAL HIGH (ref <200)
HDL: 73 mg/dL (ref >=50)
LDL Cholesterol (Calc): 122 mg/dL (calc) — ABNORMAL HIGH
Non-HDL Cholesterol (Calc): 140 mg/dL (calc) — ABNORMAL HIGH (ref <130)
Total CHOL/HDL Ratio: 2.9 (calc) (ref <5.0)
Triglycerides: 83 mg/dL (ref <150)

## 2019-03-11 LAB — TSH: TSH: 0.96 mIU/L (ref 0.40–4.50)

## 2019-03-20 ENCOUNTER — Ambulatory Visit: Payer: 59

## 2019-03-24 ENCOUNTER — Encounter: Payer: Self-pay | Admitting: Osteopathic Medicine

## 2019-03-25 ENCOUNTER — Encounter: Payer: Self-pay | Admitting: Osteopathic Medicine

## 2019-03-25 DIAGNOSIS — D72818 Other decreased white blood cell count: Secondary | ICD-10-CM

## 2019-03-25 DIAGNOSIS — Z13220 Encounter for screening for lipoid disorders: Secondary | ICD-10-CM

## 2019-04-24 DIAGNOSIS — C50911 Malignant neoplasm of unspecified site of right female breast: Secondary | ICD-10-CM | POA: Diagnosis not present

## 2019-05-01 ENCOUNTER — Ambulatory Visit
Admission: RE | Admit: 2019-05-01 | Discharge: 2019-05-01 | Disposition: A | Discharge status: Home / Self Care | Payer: 59 | Source: Ambulatory Visit | Attending: Osteopathic Medicine | Admitting: Osteopathic Medicine

## 2019-05-01 ENCOUNTER — Other Ambulatory Visit: Payer: Self-pay

## 2019-05-01 ENCOUNTER — Other Ambulatory Visit: Payer: Self-pay | Admitting: Osteopathic Medicine

## 2019-05-01 DIAGNOSIS — Z1231 Encounter for screening mammogram for malignant neoplasm of breast: Secondary | ICD-10-CM | POA: Diagnosis not present

## 2019-06-03 MED FILL — HYDROCHLOROTHIAZIDE 12.5 MG: 12.5 | 90 days supply | Qty: 90 | Fill #1

## 2019-08-01 ENCOUNTER — Encounter: Payer: Self-pay | Admitting: Osteopathic Medicine

## 2019-08-18 ENCOUNTER — Encounter: Payer: Self-pay | Admitting: Osteopathic Medicine

## 2019-08-26 MED FILL — HYDROCHLOROTHIAZIDE 12.5 MG: 12.5 | 90 days supply | Qty: 90 | Fill #2

## 2019-10-12 ENCOUNTER — Other Ambulatory Visit: Payer: Self-pay

## 2019-10-12 ENCOUNTER — Encounter: Payer: 59 | Attending: Osteopathic Medicine | Admitting: Registered"

## 2019-10-12 DIAGNOSIS — Z713 Dietary counseling and surveillance: Secondary | ICD-10-CM | POA: Insufficient documentation

## 2019-10-13 ENCOUNTER — Encounter: Payer: Self-pay | Admitting: Registered"

## 2019-10-13 NOTE — Progress Notes (Signed)
[employer] Employee Visit #1 of 3  Appt start time: [id number] end time:  1050.  Assessment:   Employee states she has gained weight over the last 4 years, first due to stress after being laid off work. Pt reports after starting in new position stress was reduced but with the lifestyle changes made during COVID restrictions gained additional weight. Employee states family history of HTN and stroke and would like to keep weight down to help prevent for herself.  Employee states she feels her biggest problem is portion sizes, enjoys food and thinks she eats too much. Employee states she stopped drinking sodas 7 yrs ago.  Employee states she eats breakfast while watching Vassie Loll. She cooks her breakfast in a [employer] microwavable container.  Employee states she is not a picky eater. Has reduced intake of egg yolks to the recent rise in her total cholesterol. Employee states she loves fresh vegetables and trying new things, shops at [employer] like a [employer].  Employee states she drinks 30 oz/coffee, enjoys the temperature, the taste and the caffeine effect.   Work: 1-5 pm Sleep: 5-6 hrs. Wakes up due to back pain, employee believes pain is due to arthritis in spine. Does not take daytime naps. Stress: 3/10, feels low, mostly comes from daughter in college  Preferred Learning Style:   No preference indicated   Learning Readiness:   Ready  MEDICATIONS: reviewed   DIETARY INTAKE: Usual eating pattern includes 2 meals and 1-2 snacks per day. 24-hr recall:  B ( AM): egg whites, vegetables  Snk ( AM): none  L ( PM): none Snk ( PM): Fiber One bar and coffee OR orange D ( PM): chicken, roasted vegetables, cabbage Snk ( PM): 1/2 peanut butter sandwich OR popcorn Beverages: water, 30 oz coffee with cream and non-nutritive sweetener  Usual physical activity: 30+ min/day; 10K steps  Estimated energy needs: 1600-1700 calories  Plan: 1. Eat breakfast mindfully at table. Stop half way  through meal and check in with hunger/fullness cues 2. Use different container to cook breakfast 3. Continue eating a variety of vegetables  Handouts given during visit include:  none  Barriers to learning/adherence to lifestyle change: none  Demonstrated degree of understanding via:  Teach Back

## 2019-10-13 NOTE — Patient Instructions (Signed)
Plan: 1. Eat breakfast mindfully at table. Stop half way through meal and check in with hunger/fullness cues 2. Use different container to cook breakfast 3. Continue eating a variety of vegetables

## 2019-10-26 ENCOUNTER — Encounter: Payer: 59 | Admitting: Registered"

## 2019-10-26 ENCOUNTER — Ambulatory Visit: Payer: 59 | Admitting: Registered"

## 2019-10-26 DIAGNOSIS — Z713 Dietary counseling and surveillance: Secondary | ICD-10-CM

## 2019-10-26 NOTE — Progress Notes (Signed)
I connected with Sabrina Abbott on 10/26/19 by video-enabled, telemedicine application, verified that I am speaking with the correct person using two identifiers. Connection was lost, RD called Employee and completed 30-min visit via telephone.  [facility] Employee Visit #2 of 3  Employee states she has not focused on portion sizes, and for last week has not been eating breakfast while watching TV because she has had conference calls for the last week. Pt states she has been eating during the conference.   Employee states she tried walking away from her meal 1/2 way through but returns and finishes her meal. RD clarified instructions to just put down her fork and assess her hunger/fullness level when deciding how much more she feels she needs to feel satisfied.  Exercise: Employee states her coworkers have started a 10-day challenge and many of them have been clocking a lot of steps. Employee states she may get 10-20K steps in a day. Employee states she uses break times during on-line conferences to get in more steps.  Employee self-assessment of eating: Employee wanted to have more ingredients in salad such as more grape tomatoes and beans but wasn't sure if she should. Wasn't sure if she should have had 2 servings of fish. Snacks: Employee likes popcorn but feels she over does it, so she avoids. Employee feels she may be drinking too much coffee (~28 oz.) Employee uses cream and 2 packets of Splenda in coffee.  Employee states she drinks 20 oz of water during the night due to hot flashes.  Work: 1-5 pm Sleep: (not re-assessed #2 visit) 5-6 hrs. Wakes up due to back pain, employee believes pain is due to arthritis in spine. Does not take daytime naps. Stress: (not re-assessed #2 visit) 3/10   Preferred Learning Style:   No preference indicated   Learning Readiness:   Ready  MEDICATIONS: reviewed   DIETARY INTAKE: Usual eating pattern includes 2 meals and 1-2 snacks per day. 24-hr recall:  B  ( AM): coffee, egg whites, vegetables OR avocado on sandwich skinnys. OR oatmeal and fruit Snk ( AM): fiber one bar or fruit  L ( PM): none Snk ( PM): Fiber One bar and coffee OR orange D ( PM): 2 servings of salmon, kale salad, roasted cabbage Snk ( PM): 1/2 peanut butter sandwich  Beverages: water, 28 oz coffee with cream and 2 splenda Usual physical activity: 30+ min/day; 10K steps  Estimated energy needs: 1600-1700 calories  Plan: 1. Eat breakfast mindfully at table. Stop halfway through meal, put fork/spoon down and check in with hunger/fullness cues 2. Add more variety to salads as desired such as beans or more tomatoes. 3. Use 1 packet of Splenda instead of 2 in coffee 4. Continue with daily activity.  Handouts given during visit include:  none  Barriers to learning/adherence to lifestyle change: none  Demonstrated degree of understanding via:  Teach Back

## 2019-10-27 DIAGNOSIS — Z713 Dietary counseling and surveillance: Secondary | ICD-10-CM | POA: Insufficient documentation

## 2019-11-09 ENCOUNTER — Encounter: Payer: 59 | Attending: Osteopathic Medicine | Admitting: Registered"

## 2019-11-09 ENCOUNTER — Encounter: Payer: Self-pay | Admitting: Registered"

## 2019-11-09 ENCOUNTER — Other Ambulatory Visit: Payer: Self-pay

## 2019-11-09 DIAGNOSIS — Z713 Dietary counseling and surveillance: Secondary | ICD-10-CM | POA: Insufficient documentation

## 2019-11-09 NOTE — Progress Notes (Signed)
[facility] Employee Visit #3 of 3  Changes from last week:  Employee is practicing mindful eating during 1 meal per day, has cut Splenda to one packet per cup of coffee, eating more baked salmon and using olive oil, garlic, pepper, curry to season.   Pt reports she is doing meatless Mondays and often will do more than once a week; she will get protein from eggs, pinto beans, and is interested in learning how to use Jackfruit.  flavored with veg broth, 1 pack of splenda, 2 T olive oil, peanut butter.  Employee states she has been participating in the [employer] virtual (series.   Work: 1-5 pm Sleep: Pt states is same as first visit)  5-6 hrs. Wakes up due to back pain and gets night sweats.  Stress: (not re-assessed #2 visit) 3/10   Preferred Learning Style:   No preference indicated   Learning Readiness:   Ready  MEDICATIONS: reviewed   DIETARY INTAKE: Usual eating pattern includes 2 meals and 1-2 snacks per day. 24-hr recall:  B ( AM): avocado toast, whole wheat English muffin, spinach, coffee Snk ( AM): none  L ( PM): baked chicken wing, stir fry [place] mix, onions and spinach, orange Snk ( PM):jhandful of spicy sesame mix D ( PM): grilled chicken wings, spinach salad, avocado Snk ( PM): apple with peanut butter  Beverages: water, 28 oz coffee with cream and 1 splenda per 14 oz, lemon herbal tea Usual physical activity: 30+ min/day; 10K steps continues  Estimated energy needs: 1600-1700 calories  Plan: 1. Eat breakfast mindfully at table. Stop halfway through meal, put fork/spoon down and check in with hunger/fullness cues - is doing once per day, may not be breakfast 2. Add more variety to salads as desired such as beans or more tomatoes. - added more tomatoes stopped eating Romaine because contamination in the news. 3. Use 1 packet of Splenda instead of 2 in coffee - completed 4. Continue with daily activity. - completed  Handouts given during visit  include:  none  Barriers to learning/adherence to lifestyle change: none  Demonstrated degree of understanding via:  Teach Back

## 2020-02-11 ENCOUNTER — Other Ambulatory Visit: Payer: Self-pay | Admitting: Osteopathic Medicine

## 2020-02-12 MED FILL — HYDROCHLOROTHIAZIDE 12.5 MG: 12.5 | 90 days supply | Qty: 90 | Fill #0

## 2020-03-10 ENCOUNTER — Encounter: Payer: Self-pay | Admitting: Osteopathic Medicine

## 2020-03-10 ENCOUNTER — Ambulatory Visit (INDEPENDENT_AMBULATORY_CARE_PROVIDER_SITE_OTHER): Payer: 59 | Admitting: Osteopathic Medicine

## 2020-03-10 ENCOUNTER — Other Ambulatory Visit: Payer: Self-pay

## 2020-03-10 ENCOUNTER — Other Ambulatory Visit: Payer: Self-pay | Admitting: Osteopathic Medicine

## 2020-03-10 VITALS — BP 147/87 | HR 61 | Temp 97.0°F | Ht 68.0 in | Wt 188.0 lb

## 2020-03-10 DIAGNOSIS — Z1211 Encounter for screening for malignant neoplasm of colon: Secondary | ICD-10-CM

## 2020-03-10 DIAGNOSIS — Z780 Asymptomatic menopausal state: Secondary | ICD-10-CM

## 2020-03-10 DIAGNOSIS — Z1231 Encounter for screening mammogram for malignant neoplasm of breast: Secondary | ICD-10-CM | POA: Diagnosis not present

## 2020-03-10 DIAGNOSIS — Z853 Personal history of malignant neoplasm of breast: Secondary | ICD-10-CM | POA: Diagnosis not present

## 2020-03-10 DIAGNOSIS — Z0000 Encounter for general adult medical examination without abnormal findings: Secondary | ICD-10-CM | POA: Diagnosis not present

## 2020-03-10 DIAGNOSIS — Z13820 Encounter for screening for osteoporosis: Secondary | ICD-10-CM | POA: Diagnosis not present

## 2020-03-10 MED ORDER — HYDROCHLOROTHIAZIDE 25 MG PO TABS: 25.0000 mg | ORAL_TABLET | Freq: Every day | ORAL | 3 refills | Status: DC

## 2020-03-10 MED FILL — HYDROCHLOROTHIAZIDE 25 MG T: 25 | 90 days supply | Qty: 90 | Fill #0

## 2020-03-10 NOTE — Progress Notes (Signed)
Sabrina Abbott is a 69 y.o. female who presents to  [place] at [facility]  today, 03/10/20, seeking care for the following:  . Annual physical  . Concern for spot on skin of chest - on exam benign mole      ASSESSMENT & PLAN with other pertinent findings:  The primary encounter diagnosis was Annual physical exam. Diagnoses of Encounter for screening mammogram for malignant neoplasm of breast, History of breast cancer, Colon cancer screening, Postmenopausal, and Screening for osteoporosis were also pertinent to this visit.   No results found for this or any previous visit (from the past 24 hour(s)).  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   Blood pressure goal 130/80 or less.   Tobacco: don't!   Alcohol: responsible moderation is ok for most adults - if you have concerns about your alcohol intake, please talk to me!   Exercise: as tolerated to reduce risk of cardiovascular disease and diabetes. Strength training will also prevent osteoporosis.   Mental health: if need for mental health care, please let me know!   Sexual / Reproductive health: if need for STD testing, or if concerns with libido/pain problems, please let me know!  Advanced Directive: Living Will and/or Healthcare Power of Attorney recommended for all adults, regardless of age or health.  Vaccines  Flu vaccine: every fall.   Shingles vaccine: after age 61. Looks like you probably had the old Zostavax shot, you are eligible for the newer Shingrix vaccine series which is a bit more effective   Pneumonia vaccine: done.  Tetanus booster: every 10 years. None on file, reported done around 2007?   COVID vaccine: THANKS for getting your vaccine! :)  Cancer screenings   Colon cancer screening: for everyone age 78-75. Colonoscopy available for  all,  Cologuard stool test due to repeat - I ordered this!   Breast cancer screening: mammogram annually   Cervical cancer screening: Can stop Pap at age 52 if previous results normal.   Lung cancer screening: not needed for non-smokers  Infection screenings  . HIV: recommended screening at least once age 56-65, more often as needed. . Gonorrhea/Chlamydia: screening as needed . Hepatitis C: recommended once for everyone age 35-75 . TB: certain at-risk populations Other . Bone Density Test: recommended for women at age 85 every 2 years - ordered!    Orders Placed This Encounter  Procedures  . MM 3D SCREEN BREAST UNI LEFT  . DG Bone Density  . CBC  . COMPLETE METABOLIC PANEL WITH GFR  . Lipid panel  . Cologuard    Meds ordered this encounter  Medications  . hydrochlorothiazide (HYDRODIURIL) 25 MG tablet    Sig: Take 1 tablet (25 mg total) by mouth daily.    Dispense:  90 tablet    Refill:  3   Constitutional:  . VSS, see nurse notes . General Appearance: alert, well-developed, well-nourished, NAD Eyes: Marland Kitchen Normal lids and conjunctive, non-icteric sclera Ears, Nose, Mouth, Throat: . Normal appearance Neck: . No masses, trachea midline . No thyroid enlargement/tenderness/mass appreciated Respiratory: . Normal respiratory effort . Breath sounds normal, no wheeze/rhonchi/rales Cardiovascular: . S1/S2 normal, no murmur/rub/gallop auscultated . No lower extremity edema Gastrointestinal: . Nontender, no masses . No hepatomegaly, no splenomegaly . No hernia appreciated Musculoskeletal:  . Gait normal . No clubbing/cyanosis of digits  Neurological: . No cranial nerve deficit on limited exam . Motor and sensation intact and symmetric Psychiatric: . Normal judgment/insight . Normal mood and affect     Follow-up instructions: Return in about 1 year (around 03/10/2021) for [place] (call week prior to visit for lab  orders).                                         BP (!) 147/87 (BP Location: Left Arm, Patient Position: Sitting)   Pulse 61   Temp (!) 97 F (36.1 C)   Ht 5\' 8"  (1.727 m)   Wt 188 lb (85.3 kg)   SpO2 100%   BMI 28.59 kg/m   Current Meds  Medication Sig  . hydrochlorothiazide (HYDRODIURIL) 25 MG tablet Take 1 tablet (25 mg total) by mouth daily.  . RABEprazole (ACIPHEX) 20 MG tablet Take 1 tablet (20 mg total) by mouth daily.  . [DISCONTINUED] hydrochlorothiazide (HYDRODIURIL) 12.5 MG tablet TAKE 1 TABLET (12.5 MG TOTAL) BY MOUTH DAILY **NO FURTHER REFILLS...DUE FOR BLOOD PRESSURE CHECK W/PROVIDER**    No results found for this or any previous visit (from the past 72 hour(s)).  No results found.     All questions at time of visit were answered - patient instructed to contact office with any additional concerns or updates.  ER/RTC precautions were reviewed with the patient as applicable.   Please note: voice recognition software was used to produce this document, and typos may escape review. Please contact Dr. Sheppard Coil for any needed clarifications.

## 2020-03-10 NOTE — Patient Instructions (Addendum)
General Preventive Care  Most recent routine screening labs: ordered today.   Blood pressure goal 130/80 or less.   Tobacco: don't!   Alcohol: responsible moderation is ok for most adults - if you have concerns about your alcohol intake, please talk to me!   Exercise: as tolerated to reduce risk of cardiovascular disease and diabetes. Strength training will also prevent osteoporosis.   Mental health: if need for mental health care, please let me know!   Sexual / Reproductive health: if need for STD testing, or if concerns with libido/pain problems, please let me know!  Advanced Directive: Living Will and/or Healthcare Power of Attorney recommended for all adults, regardless of age or health.  Vaccines  Flu vaccine: every fall.   Shingles vaccine: after age 93. Looks like you probably had the old Zostavax shot, you are eligible for the newer Shingrix vaccine series which is a bit more effective   Pneumonia vaccine: done.  Tetanus booster: every 10 years. None on file, reported done around 2007?   COVID vaccine: THANKS for getting your vaccine! :)  Cancer screenings   Colon cancer screening: for everyone age 69-75. Colonoscopy available for all,  Cologuard stool test due to repeat - I ordered this!   Breast cancer screening: mammogram annually   Cervical cancer screening: Can stop Pap at age 6 if previous results normal.   Lung cancer screening: not needed for non-smokers  Infection screenings  . HIV: recommended screening at least once age 31-65, more often as needed. . Gonorrhea/Chlamydia: screening as needed . Hepatitis C: recommended once for everyone age 77-75 . TB: certain at-risk populations Other . Bone Density Test: recommended for women at age 46 every 2 years - ordered!

## 2020-03-11 LAB — COMPLETE METABOLIC PANEL WITH GFR
AG Ratio: 1.4 (calc) (ref 1.0–2.5)
ALT: 18 U/L (ref 6–29)
AST: 23 U/L (ref 10–35)
Albumin: 4.3 g/dL (ref 3.6–5.1)
Alkaline phosphatase (APISO): 68 U/L (ref 37–153)
BUN/Creatinine Ratio: 14 (calc) (ref 6–22)
BUN: 14 mg/dL (ref 7–25)
CO2: 30 mmol/L (ref 20–32)
Calcium: 9.7 mg/dL (ref 8.6–10.4)
Chloride: 104 mmol/L (ref 98–110)
Creat: 1.02 mg/dL — ABNORMAL HIGH (ref 0.50–0.99)
GFR, Est African American: 66 mL/min/{1.73_m2} (ref >=60)
GFR, Est Non African American: 57 mL/min/{1.73_m2} — ABNORMAL LOW (ref >=60)
Globulin: 3 g/dL (calc) (ref 1.9–3.7)
Glucose, Bld: 74 mg/dL (ref 65–99)
Potassium: 5 mmol/L (ref 3.5–5.3)
Sodium: 138 mmol/L (ref 135–146)
Total Bilirubin: 0.3 mg/dL (ref 0.2–1.2)
Total Protein: 7.3 g/dL (ref 6.1–8.1)

## 2020-03-11 LAB — LIPID PANEL
Cholesterol: 192 mg/dL (ref <200)
HDL: 65 mg/dL (ref >=50)
LDL Cholesterol (Calc): 109 mg/dL (calc) — ABNORMAL HIGH
Non-HDL Cholesterol (Calc): 127 mg/dL (calc) (ref <130)
Total CHOL/HDL Ratio: 3 (calc) (ref <5.0)
Triglycerides: 90 mg/dL (ref <150)

## 2020-03-11 LAB — CBC
HCT: 38.2 % (ref 35.0–45.0)
Hemoglobin: 12.3 g/dL (ref 11.7–15.5)
MCH: 28.3 pg (ref 27.0–33.0)
MCHC: 32.2 g/dL (ref 32.0–36.0)
MCV: 87.8 fL (ref 80.0–100.0)
MPV: 10.1 fL (ref 7.5–12.5)
Platelets: 191 10*3/uL (ref 140–400)
RBC: 4.35 10*6/uL (ref 3.80–5.10)
RDW: 13.3 % (ref 11.0–15.0)
WBC: 3 10*3/uL — ABNORMAL LOW (ref 3.8–10.8)

## 2020-03-17 ENCOUNTER — Encounter: Payer: Self-pay | Admitting: Osteopathic Medicine

## 2020-03-17 ENCOUNTER — Ambulatory Visit (INDEPENDENT_AMBULATORY_CARE_PROVIDER_SITE_OTHER): Payer: 59 | Admitting: Osteopathic Medicine

## 2020-03-17 VITALS — BP 140/81 | HR 66

## 2020-03-17 DIAGNOSIS — Z23 Encounter for immunization: Secondary | ICD-10-CM | POA: Diagnosis not present

## 2020-03-17 NOTE — Progress Notes (Signed)
Patient is here for a flu vaccine. Verified no previous allergy to flu vaccine, eggs, or latex. Flu injection to left deltoid with no apparent complications. Patient advised to call with any problems.   

## 2020-03-24 DIAGNOSIS — Z1212 Encounter for screening for malignant neoplasm of rectum: Secondary | ICD-10-CM | POA: Diagnosis not present

## 2020-03-24 DIAGNOSIS — Z1211 Encounter for screening for malignant neoplasm of colon: Secondary | ICD-10-CM | POA: Diagnosis not present

## 2020-03-24 LAB — COLOGUARD: Cologuard: NEGATIVE

## 2020-03-30 LAB — COLOGUARD: COLOGUARD: NEGATIVE

## 2020-04-04 ENCOUNTER — Encounter: Payer: Self-pay | Admitting: Osteopathic Medicine

## 2020-04-04 NOTE — Progress Notes (Signed)
Patient advised of negative cologaurd results. Advised to repeat in 3 years.

## 2020-04-06 ENCOUNTER — Telehealth: Payer: Self-pay | Admitting: Osteopathic Medicine

## 2020-04-06 NOTE — Telephone Encounter (Signed)
POA paper work dropped off in [facility] box

## 2020-04-06 NOTE — Telephone Encounter (Signed)
Paperwork received and placed in Scan folder.

## 2020-05-04 ENCOUNTER — Ambulatory Visit (INDEPENDENT_AMBULATORY_CARE_PROVIDER_SITE_OTHER): Payer: 59

## 2020-05-04 ENCOUNTER — Other Ambulatory Visit: Payer: Self-pay

## 2020-05-04 DIAGNOSIS — Z853 Personal history of malignant neoplasm of breast: Secondary | ICD-10-CM

## 2020-05-04 DIAGNOSIS — Z1231 Encounter for screening mammogram for malignant neoplasm of breast: Secondary | ICD-10-CM | POA: Diagnosis not present

## 2020-05-04 DIAGNOSIS — Z13820 Encounter for screening for osteoporosis: Secondary | ICD-10-CM

## 2020-05-04 DIAGNOSIS — Z780 Asymptomatic menopausal state: Secondary | ICD-10-CM | POA: Diagnosis not present

## 2020-05-13 ENCOUNTER — Encounter: Payer: Self-pay | Admitting: Osteopathic Medicine

## 2020-06-22 MED FILL — HYDROCHLOROTHIAZIDE 25 MG T: 25 | 90 days supply | Qty: 90 | Fill #1

## 2020-07-04 ENCOUNTER — Encounter: Payer: Self-pay | Admitting: Osteopathic Medicine

## 2020-07-06 DIAGNOSIS — Z20822 Contact with and (suspected) exposure to covid-19: Secondary | ICD-10-CM | POA: Diagnosis not present

## 2020-09-13 MED FILL — HYDROCHLOROTHIAZIDE 25 MG T: 25 | 90 days supply | Qty: 90 | Fill #2

## 2020-10-31 ENCOUNTER — Encounter: Payer: Self-pay | Admitting: Osteopathic Medicine

## 2020-10-31 NOTE — Telephone Encounter (Signed)
Patient has been scheduled. AM

## 2020-12-13 ENCOUNTER — Other Ambulatory Visit (HOSPITAL_BASED_OUTPATIENT_CLINIC_OR_DEPARTMENT_OTHER): Payer: Self-pay

## 2020-12-13 MED FILL — Hydrochlorothiazide Tab 25 MG: ORAL | 90 days supply | Qty: 90 | Fill #0 | Status: AC

## 2021-03-05 ENCOUNTER — Other Ambulatory Visit: Payer: Self-pay | Admitting: Osteopathic Medicine

## 2021-03-07 ENCOUNTER — Other Ambulatory Visit (HOSPITAL_BASED_OUTPATIENT_CLINIC_OR_DEPARTMENT_OTHER): Payer: Self-pay

## 2021-03-07 MED ORDER — HYDROCHLOROTHIAZIDE 25 MG PO TABS
ORAL_TABLET | Freq: Every day | ORAL | 3 refills | Status: DC
  Filled 2021-03-07: qty 90, 90d supply, fill #0

## 2021-03-21 ENCOUNTER — Other Ambulatory Visit: Payer: Self-pay | Admitting: Family Medicine

## 2021-03-21 DIAGNOSIS — Z1231 Encounter for screening mammogram for malignant neoplasm of breast: Secondary | ICD-10-CM

## 2021-03-30 ENCOUNTER — Encounter: Payer: Self-pay | Admitting: Family Medicine

## 2021-03-30 ENCOUNTER — Other Ambulatory Visit: Payer: Self-pay

## 2021-03-30 ENCOUNTER — Ambulatory Visit (INDEPENDENT_AMBULATORY_CARE_PROVIDER_SITE_OTHER): Payer: Medicare HMO | Admitting: Family Medicine

## 2021-03-30 VITALS — BP 132/84 | HR 72 | Temp 98.8°F | Resp 17 | Wt 196.0 lb

## 2021-03-30 DIAGNOSIS — Z1329 Encounter for screening for other suspected endocrine disorder: Secondary | ICD-10-CM | POA: Diagnosis not present

## 2021-03-30 DIAGNOSIS — Z131 Encounter for screening for diabetes mellitus: Secondary | ICD-10-CM | POA: Diagnosis not present

## 2021-03-30 DIAGNOSIS — H6123 Impacted cerumen, bilateral: Secondary | ICD-10-CM

## 2021-03-30 DIAGNOSIS — Z23 Encounter for immunization: Secondary | ICD-10-CM

## 2021-03-30 DIAGNOSIS — Z13220 Encounter for screening for lipoid disorders: Secondary | ICD-10-CM

## 2021-03-30 DIAGNOSIS — E7800 Pure hypercholesterolemia, unspecified: Secondary | ICD-10-CM | POA: Diagnosis not present

## 2021-03-30 DIAGNOSIS — Z0000 Encounter for general adult medical examination without abnormal findings: Secondary | ICD-10-CM | POA: Diagnosis not present

## 2021-03-30 NOTE — Progress Notes (Signed)
BP 132/84   Pulse 72   Temp 98.8 F (37.1 C)   Resp 17   Wt 196 lb (88.9 kg)   SpO2 99%   BMI 29.80 kg/m    Subjective:    Patient ID: Sabrina Abbott, female    DOB: [birth year], 70 y.o.   MRN: [id number]  HPI: Sabrina Abbott is a 69 y.o. female presenting on 03/30/2021 for comprehensive medical examination. Current medical complaints include: none  She currently lives with: daughter Interim Problems from her last visit: no   She reports regular vision exams q1-5y: yes She reports regular dental exams q 99m: yes Her diet consists of: vegetarian She endorses exercise and/or activity of: 10k steps a day, walking, exercise videos She works at: retired [employer]  She denies ETOH use. She denies nictoine use. She denies illegal substance use.    She reports no periods - menopause age 3 Current menopausal symptoms: no She is not currently  sexually active  She denies  concerns today about STI Contraception choices are: n/a  She denies concerns about skin changes today. She denies concerns about bowel changes today. She denies concerns about bladder changes today.  Depression Screen done today and results listed below:  Depression screen [facility] 2/9 03/30/2021 10/13/2019 03/10/2019 03/06/2018 09/05/2017  Decreased Interest 0 0 0 0 0  Down, Depressed, Hopeless 0 0 0 0 0  PHQ - 2 Score 0 0 0 0 0  Altered sleeping - - - 0 0  Tired, decreased energy - - - 0 0  Change in appetite - - - 2 1  Feeling bad or failure about yourself  - - - 0 0  Trouble concentrating - - - 0 0  Moving slowly or fidgety/restless - - - 0 0  Suicidal thoughts - - - 0 0  PHQ-9 Score - - - 2 1  Difficult doing work/chores - - - Not difficult at all Not difficult at all    She does not have a history of falls.   Past Medical History:  Past Medical History:  Diagnosis Date   Breast cancer ([place])    Cancer ([place])    breast   Hypertension    Leucopenia    Vitamin D deficiency     Surgical History:  Past  Surgical History:  Procedure Laterality Date   LIMBAL STEM CELL TRANSPLANT     MASTECTOMY Right    MODIFIED RADICAL MASTECTOMY Right    TUBAL LIGATION     WISDOM TOOTH EXTRACTION      Medications:  Current Outpatient Medications on File Prior to Visit  Medication Sig   hydrochlorothiazide (HYDRODIURIL) 25 MG tablet TAKE 1 TABLET (25 MG TOTAL) BY MOUTH DAILY.   RABEprazole (ACIPHEX) 20 MG tablet Take 1 tablet (20 mg total) by mouth daily.   No current facility-administered medications on file prior to visit.    Allergies:  Allergies  Allergen Reactions   Iodinated Diagnostic Agents Other (See Comments)    Made one side of her "right side of body hurt real bad"    Lisinopril Other (See Comments)   Thyme Other (See Comments)    Patient states that allergist said she was not allergic to thyme    Iodine     Social History:  Social History   Socioeconomic History   Marital status: Married    Spouse name: Not on file   Number of children: Not on file   Years of education: Not on file  Highest education level: Not on file  Occupational History   Not on file  Tobacco Use   Smoking status: Never   Smokeless tobacco: Never  Vaping Use   Vaping Use: Never used  Substance and Sexual Activity   Alcohol use: No   Drug use: No   Sexual activity: Yes    Partners: Male  Other Topics Concern   Not on file  Social History Narrative   Not on file   Social Determinants of Health   Financial Resource Strain: Not on file  Food Insecurity: Not on file  Transportation Needs: Not on file  Physical Activity: Not on file  Stress: Not on file  Social Connections: Not on file  Intimate Partner Violence: Not on file   Social History   Tobacco Use  Smoking Status Never  Smokeless Tobacco Never   Social History   Substance and Sexual Activity  Alcohol Use No    Family History:  Family History  Problem Relation Age of Onset   Heart disease Father    Alcohol abuse  Father    Hyperlipidemia Father    Hypertension Father    Stroke Father    Prostate cancer Brother    Diabetes Brother    Hypertension Brother    Heart disease Sister     Past medical history, surgical history, medications, allergies, family history and social history reviewed with patient today and changes made to appropriate areas of the chart.   All ROS negative except what is listed above and in the HPI.      Objective:    BP 132/84   Pulse 72   Temp 98.8 F (37.1 C)   Resp 17   Wt 196 lb (88.9 kg)   SpO2 99%   BMI 29.80 kg/m   Wt Readings from Last 3 Encounters:  03/30/21 196 lb (88.9 kg)  03/10/20 188 lb (85.3 kg)  03/10/19 184 lb 9.6 oz (83.7 kg)    Physical Exam Vitals and nursing note reviewed.  Constitutional:      General: She is not in acute distress.    Appearance: Normal appearance.  HENT:     Head: Normocephalic and atraumatic.     Right Ear: Hearing and external ear normal. There is impacted cerumen.     Left Ear: Hearing and external ear normal. There is impacted cerumen.     Nose: Nose normal. No mucosal edema, congestion or rhinorrhea.     Right Sinus: No maxillary sinus tenderness or frontal sinus tenderness.     Left Sinus: No maxillary sinus tenderness or frontal sinus tenderness.     Mouth/Throat:     Lips: Pink.     Mouth: Mucous membranes are moist.     Tongue: No lesions.     Pharynx: Oropharynx is clear. Uvula midline. No oropharyngeal exudate or posterior oropharyngeal erythema.     Tonsils: No tonsillar exudate or tonsillar abscesses.  Eyes:     General: Lids are normal. Vision grossly intact.     Extraocular Movements: Extraocular movements intact.     Conjunctiva/sclera: Conjunctivae normal.     Pupils: Pupils are equal, round, and reactive to light.     Funduscopic exam:    Right eye: Red reflex present.        Left eye: Red reflex present.    Visual Fields: Right eye visual fields normal and left eye visual fields normal.   Neck:     Thyroid: No thyroid mass, thyromegaly or thyroid tenderness.  Vascular: No carotid bruit or JVD.     Trachea: Trachea normal.  Cardiovascular:     Rate and Rhythm: Normal rate and regular rhythm.     Chest Wall: PMI is not displaced.     Pulses: Normal pulses.     Heart sounds: Normal heart sounds. No murmur heard. Pulmonary:     Effort: Pulmonary effort is normal. No respiratory distress.     Breath sounds: Normal breath sounds.  Chest:     Chest wall: No deformity.  Breasts:    Breasts are symmetrical.  Abdominal:     General: Abdomen is flat. Bowel sounds are normal. There is no distension.     Palpations: Abdomen is soft. There is no hepatomegaly, splenomegaly or mass.     Tenderness: There is no abdominal tenderness. There is no right CVA tenderness, left CVA tenderness, guarding or rebound.  Genitourinary:    Vagina: Normal. No tenderness or lesions.     Cervix: No cervical motion tenderness, discharge, friability or lesion.     Uterus: Normal. Not tender.      Adnexa: Right adnexa normal and left adnexa normal.       Right: No mass or tenderness.         Left: No mass or tenderness.       Comments: deferred Musculoskeletal:        General: No swelling, tenderness, deformity or signs of injury. Normal range of motion.     Right shoulder: Normal.     Left shoulder: Normal.     Right upper arm: Normal.     Left upper arm: Normal.     Cervical back: Normal, full passive range of motion without pain, normal range of motion and neck supple. No tenderness.     Thoracic back: Normal.     Lumbar back: Normal.     Right hip: Normal.     Left hip: Normal.     Right upper leg: Normal.     Left upper leg: Normal.     Right knee: Normal.     Left knee: Normal.     Right lower leg: No edema.     Left lower leg: No edema.  Feet:     Right foot:     Skin integrity: Skin integrity normal.     Toenail Condition: Right toenails are normal.     Left foot:     Skin  integrity: Skin integrity normal.     Toenail Condition: Left toenails are normal.  Lymphadenopathy:     Head:     Right side of head: No submental or tonsillar adenopathy.     Left side of head: No submental or tonsillar adenopathy.     Cervical: No cervical adenopathy.     Right cervical: No superficial cervical adenopathy.    Left cervical: No superficial cervical adenopathy.     Upper Body:     Right upper body: No supraclavicular adenopathy.     Left upper body: No supraclavicular adenopathy.  Skin:    General: Skin is warm and dry.     Capillary Refill: Capillary refill takes less than 2 seconds.     Findings: No bruising or rash.     Nails: There is no clubbing.  Neurological:     General: No focal deficit present.     Mental Status: She is alert and oriented to person, place, and time.     Cranial Nerves: Cranial nerves are intact.  Sensory: Sensation is intact.     Motor: Motor function is intact.     Coordination: Coordination is intact.     Gait: Gait is intact.  Psychiatric:        Attention and Perception: Attention normal.        Mood and Affect: Mood and affect normal.        Speech: Speech normal.        Behavior: Behavior normal. Behavior is cooperative.        Thought Content: Thought content normal.        Cognition and Memory: Cognition and memory normal.        Judgment: Judgment normal.    Results for orders placed or performed in visit on 04/04/20  Cologuard  Result Value Ref Range   Cologuard Negative Negative      Assessment & Plan:   Problem List Items Addressed This Visit   None Visit Diagnoses     Needs flu shot    -  Primary   Relevant Orders   Flu Vaccine QUAD High Dose(Fluad) (Completed)   Annual physical exam       Relevant Orders   TSH   Lipid Panel w/reflex Direct LDL   COMPLETE METABOLIC PANEL WITH GFR   CBC with Differential/Platelet   Diabetes mellitus screening       Relevant Orders   COMPLETE METABOLIC PANEL WITH GFR    Lipid screening       Relevant Orders   Lipid Panel w/reflex Direct LDL   Thyroid disorder screen       Relevant Orders   TSH   Bilateral impacted cerumen           Indication: Cerumen impaction of the ear(s)  Medical necessity statement: On physical examination, cerumen impairs clinically significant portions of the external auditory canal, and tympanic membrane. Noted obstructive, copious cerumen that cannot be removed without magnification and instrumentations requiring physician skills Consent: Discussed benefits and risks of procedure and verbal consent obtained Procedure: Patient was prepped for the procedure. Utilized an otoscope to assess and take note of the ear canal, the tympanic membrane, and the presence, amount, and placement of the cerumen. Gentle water irrigation was utilized to remove cerumen.  Post procedure examination: Patient tolerated procedure well. The patient is made aware that they may experience temporary vertigo, temporary hearing loss, and temporary discomfort. If these symptom last for more than 24 hours to call the clinic or proceed to the ED. Minimal improvement with irrigation. Patient would like to try debrox drops to soften the cerumen before irrigating again. Patient aware of signs/symptoms requiring further/urgent evaluation.     LABORATORY TESTING:  - Health maintenance labs ordered today as discussed above.           CBC, CMP, LIPIDS          TSH  - STI testing: deferred - Pap smear: not applicable    IMMUNIZATIONS:   - Tdap: Tetanus vaccination status reviewed: last tetanus booster within 10 years. - Influenza: Administered today - Pneumovax: Not applicable - Prevnar: up to date - HPV: Not applicable - Shingrix vaccine: Refused - COVID-19: Up to date  SCREENING: - Mammogram:  scheduled - Bone Density: Up to date - Colonoscopy: Up to date  - AAA Screening: Not applicable  -Hearing Test: Not applicable  -Spirometry: Not applicable   - Lung Cancer Screening: Not applicable    PATIENT COUNSELING:   Advised to take 1 mg of folate supplement  per day if capable of pregnancy.   Sexuality: Discussed sexually transmitted diseases, partner selection, use of condoms, avoidance of unintended pregnancy, and contraceptive alternatives.    I discussed with the patient that most people either abstain from alcohol or drink within safe limits (<=14/week and <=4 drinks/occasion for males, <=7/weeks and <= 3 drinks/occasion for females) and that the risk for alcohol disorders and other health effects rises proportionally with the number of drinks per week and how often a drinker exceeds daily limits.  Discussed cessation/primary prevention of drug use and availability of treatment for abuse.   Diet: Encouraged to adjust caloric intake to maintain or achieve ideal body weight, to reduce intake of dietary saturated fat and total fat, to limit sodium intake by avoiding high sodium foods and not adding table salt, and to maintain adequate dietary potassium and calcium preferably from fresh fruits, vegetables, and low-fat dairy products. Encouraged vitamin D 1000 units and Calcium 1300mg  or 4 servings of dairy a day.  Emphasized the importance of regular exercise.  Injury prevention: Discussed safety belts, safety helmets, smoke detector, smoking near bedding or upholstery.   Dental health: Discussed importance of regular tooth brushing, flossing, and dental visits.  Follow up plan:  Return in about 1 year (around 03/30/2022).  [employer] Olevia Bowens, DNP, FNP-C

## 2021-03-31 LAB — CBC WITH DIFFERENTIAL/PLATELET
Absolute Monocytes: 366 cells/uL (ref 200–950)
Basophils Absolute: 9 cells/uL (ref 0–200)
Basophils Relative: 0.3 %
Eosinophils Absolute: 71 cells/uL (ref 15–500)
Eosinophils Relative: 2.3 %
HCT: 37.5 % (ref 35.0–45.0)
Hemoglobin: 11.7 g/dL (ref 11.7–15.5)
Lymphs Abs: 1169 cells/uL (ref 850–3900)
MCH: 28.1 pg (ref 27.0–33.0)
MCHC: 31.2 g/dL — ABNORMAL LOW (ref 32.0–36.0)
MCV: 89.9 fL (ref 80.0–100.0)
MPV: 9.9 fL (ref 7.5–12.5)
Monocytes Relative: 11.8 %
Neutro Abs: 1485 cells/uL — ABNORMAL LOW (ref 1500–7800)
Neutrophils Relative %: 47.9 %
Platelets: 190 10*3/uL (ref 140–400)
RBC: 4.17 10*6/uL (ref 3.80–5.10)
RDW: 13.1 % (ref 11.0–15.0)
Total Lymphocyte: 37.7 %
WBC: 3.1 10*3/uL — ABNORMAL LOW (ref 3.8–10.8)

## 2021-03-31 LAB — COMPLETE METABOLIC PANEL WITH GFR
AG Ratio: 1.8 (calc) (ref 1.0–2.5)
ALT: 21 U/L (ref 6–29)
AST: 22 U/L (ref 10–35)
Albumin: 4.4 g/dL (ref 3.6–5.1)
Alkaline phosphatase (APISO): 83 U/L (ref 37–153)
BUN: 19 mg/dL (ref 7–25)
CO2: 29 mmol/L (ref 20–32)
Calcium: 9.5 mg/dL (ref 8.6–10.4)
Chloride: 105 mmol/L (ref 98–110)
Creat: 0.94 mg/dL (ref 0.50–1.05)
Globulin: 2.5 g/dL (calc) (ref 1.9–3.7)
Glucose, Bld: 82 mg/dL (ref 65–99)
Potassium: 4.9 mmol/L (ref 3.5–5.3)
Sodium: 139 mmol/L (ref 135–146)
Total Bilirubin: 0.3 mg/dL (ref 0.2–1.2)
Total Protein: 6.9 g/dL (ref 6.1–8.1)
eGFR: 66 mL/min/{1.73_m2} (ref >=60)

## 2021-03-31 LAB — LIPID PANEL W/REFLEX DIRECT LDL
Cholesterol: 192 mg/dL (ref <200)
HDL: 63 mg/dL (ref >=50)
LDL Cholesterol (Calc): 110 mg/dL (calc) — ABNORMAL HIGH
Non-HDL Cholesterol (Calc): 129 mg/dL (calc) (ref <130)
Total CHOL/HDL Ratio: 3 (calc) (ref <5.0)
Triglycerides: 93 mg/dL (ref <150)

## 2021-03-31 LAB — TSH: TSH: 1.44 mIU/L (ref 0.40–4.50)

## 2021-05-10 ENCOUNTER — Telehealth: Payer: Self-pay

## 2021-05-10 ENCOUNTER — Ambulatory Visit (INDEPENDENT_AMBULATORY_CARE_PROVIDER_SITE_OTHER): Payer: Medicare HMO

## 2021-05-10 ENCOUNTER — Other Ambulatory Visit: Payer: Self-pay

## 2021-05-10 DIAGNOSIS — Z1231 Encounter for screening mammogram for malignant neoplasm of breast: Secondary | ICD-10-CM | POA: Diagnosis not present

## 2021-05-10 NOTE — Telephone Encounter (Signed)
Open in error

## 2021-05-12 ENCOUNTER — Other Ambulatory Visit: Payer: Self-pay | Admitting: Family Medicine

## 2021-05-12 MED ORDER — HYDROCHLOROTHIAZIDE 25 MG PO TABS: ORAL_TABLET | Freq: Every day | ORAL | 3 refills | Status: DC

## 2021-06-02 ENCOUNTER — Encounter: Payer: Self-pay | Admitting: Emergency Medicine

## 2021-06-02 ENCOUNTER — Emergency Department
Admission: EM | Admit: 2021-06-02 | Discharge: 2021-06-02 | Disposition: A | Discharge status: Home / Self Care | Payer: Medicare HMO | Source: Home / Self Care | Attending: Family Medicine | Admitting: Family Medicine

## 2021-06-02 ENCOUNTER — Other Ambulatory Visit: Payer: Self-pay

## 2021-06-02 DIAGNOSIS — Q846 Other congenital malformations of nails: Secondary | ICD-10-CM | POA: Diagnosis not present

## 2021-06-02 MED ORDER — DOXYCYCLINE HYCLATE 100 MG PO CAPS: ORAL_CAPSULE | ORAL | 0 refills | Status: DC

## 2021-06-02 NOTE — Discharge Instructions (Addendum)
Continue daily warm soaks to finger.  If symptoms become significantly worse during the night or over the weekend, proceed to the local emergency room.

## 2021-06-02 NOTE — ED Provider Notes (Signed)
[facility]    CSN: [id number] Arrival date & time: 06/02/21  0915      History   Chief Complaint Chief Complaint  Patient presents with   Nail Problem    R middle finger  infected nail    HPI Sabrina Abbott is a 70 y.o. female.   Patient noticed soreness and mild swelling at the base of her right middle fingernail four days ago.  Her finger did not improve after warm soaks, and she now has increased swelling extending to the middle phalanx.  The history is provided by the patient.  Hand Pain This is a new problem. The current episode started more than 2 days ago. The problem occurs constantly. The problem has been gradually worsening. Exacerbated by: palpation. Nothing relieves the symptoms. Treatments tried: warm soaks. The treatment provided no relief.   Past Medical History:  Diagnosis Date   Breast cancer ([place])    Cancer ([place])    breast   Hypertension    Leucopenia    Vitamin D deficiency     Patient Active Problem List   Diagnosis Date Noted   Nutritional counseling 10/27/2019   Colon cancer screening 03/25/2017   Elevated liver enzymes 03/05/2017   Leucopenia 03/05/2017   Malignant neoplasm of right breast ([place]) 12/22/2014   Angioedema 07/16/2014   Dermatofibroma of deltoid region 09/25/2012   Elevated blood pressure 05/05/2012   GERD (gastroesophageal reflux disease) 05/05/2012   History of breast cancer 05/05/2012    Past Surgical History:  Procedure Laterality Date   LIMBAL STEM CELL TRANSPLANT     MASTECTOMY Right    MODIFIED RADICAL MASTECTOMY Right    TUBAL LIGATION     WISDOM TOOTH EXTRACTION      OB History   No obstetric history on file.      Home Medications    Prior to Admission medications   Medication Sig Start Date End Date Taking? Authorizing Provider  doxycycline (VIBRAMYCIN) 100 MG capsule Take one cap PO Q12hr with food. 06/02/21  Yes Kandra Nicolas, MD  hydrochlorothiazide (HYDRODIURIL) 25 MG tablet TAKE 1  TABLET (25 MG TOTAL) BY MOUTH DAILY. 05/12/21 05/12/22  Terrilyn Saver, NP  RABEprazole (ACIPHEX) 20 MG tablet Take 1 tablet (20 mg total) by mouth daily. 12/09/18   Emeterio Reeve, DO    Family History Family History  Problem Relation Age of Onset   Heart disease Father    Alcohol abuse Father    Hyperlipidemia Father    Hypertension Father    Stroke Father    Prostate cancer Brother    Diabetes Brother    Hypertension Brother    Heart disease Sister     Social History Social History   Tobacco Use   Smoking status: Never   Smokeless tobacco: Never  Vaping Use   Vaping Use: Never used  Substance Use Topics   Alcohol use: No   Drug use: No     Allergies   Iodinated diagnostic agents, Lisinopril, and Iodine   Review of Systems Review of Systems  Constitutional:  Negative for chills, diaphoresis and fever.  Musculoskeletal:  Negative for joint swelling.  Skin:  Positive for color change.  All other systems reviewed and are negative.   Physical Exam Triage Vital Signs ED Triage Vitals  Enc Vitals Group     BP 06/02/21 0931 125/82     Pulse Rate 06/02/21 0931 71     Resp 06/02/21 0931 16  Temp 06/02/21 0931 98.3 F (36.8 C)     Temp Source 06/02/21 0931 Oral     SpO2 06/02/21 0931 99 %     Weight 06/02/21 0932 188 lb (85.3 kg)     Height 06/02/21 0932 5\' 8"  (1.727 m)     Head Circumference --      Peak Flow --      Pain Score 06/02/21 0932 3     Pain Loc --      Pain Edu? --      Excl. in [place]? --    No data found.  Updated Vital Signs BP 125/82 (BP Location: Left Arm)   Pulse 71   Temp 98.3 F (36.8 C) (Oral)   Resp 16   Ht 5\' 8"  (1.727 m)   Wt 85.3 kg   SpO2 99%   BMI 28.59 kg/m   Visual Acuity Right Eye Distance:   Left Eye Distance:   Bilateral Distance:    Right Eye Near:   Left Eye Near:    Bilateral Near:     Physical Exam Vitals and nursing note reviewed.  Cardiovascular:     Rate and Rhythm: Normal rate.  Pulmonary:      Effort: Pulmonary effort is normal.  Musculoskeletal:       Hands:     Comments: Right third finger has mild swelling, erythema, and tenderness to palpation at the base of fingernail.  There is no fluctuance however.  Mild erythema and tenderness extends to the middle phalanx.  All joints of the middle finger have full range of motion.  Skin:    General: Skin is warm and dry.  Neurological:     Mental Status: She is alert.    UC Treatments / Results  Labs (all labs ordered are listed, but only abnormal results are displayed) Labs Reviewed - No data to display  EKG   Radiology No results found.  Procedures Procedures (including critical care time)  Medications Ordered in UC Medications - No data to display  Initial Impression / Assessment and Plan / UC Course  I have reviewed the triage vital signs and the nursing notes.  Pertinent labs & imaging results that were available during my care of the patient were reviewed by me and considered in my medical decision making (see chart for details).    Begin doxycycline for staph coverage. Followup with Family Doctor if not improved in one week.  Final Clinical Impressions(s) / UC Diagnoses   Final diagnoses:  Eponychia     Discharge Instructions      Continue daily warm soaks to finger.  If symptoms become significantly worse during the night or over the weekend, proceed to the local emergency room.     ED Prescriptions     Medication Sig Dispense Auth. Provider   doxycycline (VIBRAMYCIN) 100 MG capsule Take one cap PO Q12hr with food. 14 capsule Kandra Nicolas, MD         Kandra Nicolas, MD 06/05/21 [phone]

## 2021-06-02 NOTE — ED Triage Notes (Signed)
Pt noticed right middle finger nail bed looked infected on Monday  Did some salt soaks Did not resolve  R middle finger is now swollen & painful to touch

## 2021-06-22 ENCOUNTER — Ambulatory Visit (INDEPENDENT_AMBULATORY_CARE_PROVIDER_SITE_OTHER): Payer: Medicare HMO | Admitting: Medical-Surgical

## 2021-06-22 ENCOUNTER — Other Ambulatory Visit: Payer: Self-pay

## 2021-06-22 ENCOUNTER — Encounter: Payer: Self-pay | Admitting: Medical-Surgical

## 2021-06-22 VITALS — BP 121/76 | HR 69 | Resp 20 | Ht 68.0 in | Wt 196.8 lb

## 2021-06-22 DIAGNOSIS — Z7689 Persons encountering health services in other specified circumstances: Secondary | ICD-10-CM | POA: Diagnosis not present

## 2021-06-22 DIAGNOSIS — R635 Abnormal weight gain: Secondary | ICD-10-CM | POA: Diagnosis not present

## 2021-06-22 DIAGNOSIS — K219 Gastro-esophageal reflux disease without esophagitis: Secondary | ICD-10-CM | POA: Diagnosis not present

## 2021-06-22 DIAGNOSIS — Q846 Other congenital malformations of nails: Secondary | ICD-10-CM

## 2021-06-22 MED ORDER — SULFAMETHOXAZOLE-TRIMETHOPRIM 800-160 MG PO TABS: 1.0000 | ORAL_TABLET | Freq: Two times a day (BID) | ORAL | 0 refills | Status: DC

## 2021-06-22 NOTE — Progress Notes (Signed)
HPI with pertinent ROS:   CC: transfer of care  HPI: Very pleasant 70 year old female presenting today to transfer care to a new PCP and for the following concerns:  Weight gain-Notes that she has been vegetarian for the past year and a half.  She does still eat eggs and dairy but does not eat fish.  She does endorse eating a lot of beans to get fiber and protein.  Since she is gone vegetarian, she notes that she continues to gain weight and is very concerned about this.  She was looking into a program with [facility] for weight loss but found out she did not qualify for this and now wonders what she can do to help with weight loss.  She is getting at least 10,000 steps per day but is not currently doing any weightbearing exercises.  She was looking into a bone loss study with [facility] as well and was trying to get in for this but her hemoglobin was 11.9, just below the cutoff of 12.0.  Notes that the study involved getting 3 CT scans in a 79-month period which was concerning since she already has a chronically low white blood count.  Wonders if we can reevaluate her labs in a couple of months to see if her hemoglobin has increased and she will then qualify.  Not currently taking a multivitamin with iron although she notes that her iron is usually on the low end of the recommended range.  Was seen in urgent care on 11/25 for right middle finger eponychia and prescribe doxycycline for 7-day course.  Notes that she did have some purulent drainage come out of the finger after about 3 days on the antibiotics.  Has completed the antibiotic course (done about a week ago) but her middle finger is still swollen and slightly tender on the medial aspect of the nail.  She also has skin peeling over the distal end of the middle finger.  I reviewed the past medical history, family history, social history, surgical history, and allergies today and no changes were needed.  Please see the problem list section  below in epic for further details.   Physical exam:   General: Well Developed, well nourished, and in no acute distress.  Neuro: Alert and oriented x3.  HEENT: Normocephalic, atraumatic.  Skin: Warm and dry. Cardiac: Regular rate and rhythm, no murmurs rubs or gallops, no lower extremity edema.  Respiratory: Clear to auscultation bilaterally. Not using accessory muscles, speaking in full sentences.  Impression and Recommendations:    1. Encounter to establish care Reviewed available information and discussed care concerns with patient.   2. Gastroesophageal reflux disease without esophagitis Continue Aciphex 20 mg daily as prescribed  3. Eponychia Continued erythema and redness at the site concerning.  Adding a short course of Bactrim twice daily x3 days to see if this will cleared up completely.  Advised patient to monitor symptoms for worsening.  Peeling of the skin in the area can be expected.  4. Weight gain She is vegetarian and likely not getting enough protein overall.  Discussed sources for protein when eating a vegetarian diet.  She does like dairy so recommend looking into protein shakes for supplementation.  Also recommend the addition of weightbearing exercises using light weights to start as this will likely help build muscle and protect her bones.  Return for Reevaluation of labs in early to middle February as discussed. ___________________________________________ Clearnce Sorrel, DNP, APRN, FNP-BC Primary Care and Sports Medicine  [place]

## 2021-06-23 ENCOUNTER — Encounter: Payer: Self-pay | Admitting: Medical-Surgical

## 2021-06-23 DIAGNOSIS — U071 COVID-19: Secondary | ICD-10-CM | POA: Diagnosis not present

## 2021-06-23 DIAGNOSIS — Z20822 Contact with and (suspected) exposure to covid-19: Secondary | ICD-10-CM | POA: Diagnosis not present

## 2021-07-03 DIAGNOSIS — Z20822 Contact with and (suspected) exposure to covid-19: Secondary | ICD-10-CM | POA: Diagnosis not present

## 2021-07-04 ENCOUNTER — Encounter: Payer: Self-pay | Admitting: Medical-Surgical

## 2021-09-04 ENCOUNTER — Ambulatory Visit (INDEPENDENT_AMBULATORY_CARE_PROVIDER_SITE_OTHER): Payer: Medicare HMO | Admitting: Medical-Surgical

## 2021-09-04 DIAGNOSIS — Z0000 Encounter for general adult medical examination without abnormal findings: Secondary | ICD-10-CM | POA: Diagnosis not present

## 2021-09-04 NOTE — Progress Notes (Addendum)
MEDICARE ANNUAL WELLNESS VISIT  09/04/2021  Telephone Visit Disclaimer This Medicare AWV was conducted by telephone due to national recommendations for restrictions regarding the COVID-19 Pandemic (e.g. social distancing).  I verified, using two identifiers, that I am speaking with Sabrina Abbott or their authorized healthcare agent. I discussed the limitations, risks, security, and privacy concerns of performing an evaluation and management service by telephone and the potential availability of an in-person appointment in the future. The patient expressed understanding and agreed to proceed.  Location of Patient: Home Location of Provider (nurse):  In the office.  Subjective:    Sabrina Abbott is a 71 y.o. female patient of Samuel Bouche, NP who had a Medicare Annual Wellness Visit today via telephone. Sabrina Abbott is Retired and lives with their daughter. she has 2 children. she reports that she is socially active and does interact with friends/family regularly. she is moderately physically active and enjoys baking and socializing with her friends..  Patient Care Team: Samuel Bouche, NP as PCP - General (Nurse Practitioner)  Advanced Directives 10/13/2019  Does Patient Have a Medical Advance Directive? No  Would patient like information on creating a medical advance directive? Yes (MAU/Ambulatory/Procedural Areas - Information given)    Hospital Utilization Over the Past 12 Months: # of hospitalizations or ER visits: 0 # of surgeries: 0  Review of Systems    Patient reports that her overall health is unchanged compared to last year.  History obtained from chart review and the patient  Patient Reported Readings (BP, Pulse, CBG, Weight, etc) none  Pain Assessment Pain : No/denies pain     Current Medications & Allergies (verified) Allergies as of 09/04/2021       Reactions   Iodinated Contrast Media Other (See Comments)   Made one side of her "right side of body hurt real bad"    Lisinopril Swelling   Iodine    Soy Allergy         Medication List        Accurate as of September 04, 2021  8:24 AM. If you have any questions, ask your nurse or doctor.          hydrochlorothiazide 25 MG tablet Commonly known as: HYDRODIURIL TAKE 1 TABLET (25 MG TOTAL) BY MOUTH DAILY.   RABEprazole 20 MG tablet Commonly known as: Aciphex Take 1 tablet (20 mg total) by mouth daily.   sulfamethoxazole-trimethoprim 800-160 MG tablet Commonly known as: Bactrim DS Take 1 tablet by mouth 2 (two) times daily.        History (reviewed): Past Medical History:  Diagnosis Date   Breast cancer ([place])    Cancer ([place])    breast   Hypertension    Leucopenia    Vitamin D deficiency    Past Surgical History:  Procedure Laterality Date   LIMBAL STEM CELL TRANSPLANT     MASTECTOMY Right    MODIFIED RADICAL MASTECTOMY Right    TUBAL LIGATION     WISDOM TOOTH EXTRACTION     Family History  Problem Relation Age of Onset   Heart disease Father    Alcohol abuse Father    Hyperlipidemia Father    Hypertension Father    Stroke Father    Prostate cancer Brother    Diabetes Brother    Hypertension Brother    Heart disease Sister    Social History   Socioeconomic History   Marital status: Married    Spouse name: Not on file   Number  of children: Not on file   Years of education: Not on file   Highest education level: Not on file  Occupational History   Not on file  Tobacco Use   Smoking status: Never   Smokeless tobacco: Never  Vaping Use   Vaping Use: Never used  Substance and Sexual Activity   Alcohol use: No   Drug use: No   Sexual activity: Yes    Partners: Male  Other Topics Concern   Not on file  Social History Narrative   Not on file   Social Determinants of Health   Financial Resource Strain: Not on file  Food Insecurity: Not on file  Transportation Needs: Not on file  Physical Activity: Not on file  Stress: Not on file  Social Connections:  Not on file    Activities of Daily Living In your present state of health, do you have any difficulty performing the following activities: 09/01/2021  Hearing? N  Vision? N  Difficulty concentrating or making decisions? N  Walking or climbing stairs? N  Dressing or bathing? N  Doing errands, shopping? N  Preparing Food and eating ? N  Using the Toilet? N  In the past six months, have you accidently leaked urine? Y  Do you have problems with loss of bowel control? N  Managing your Medications? N  Managing your Finances? N  Housekeeping or managing your Housekeeping? N  Some recent data might be hidden    Patient Education/ Literacy How often do you need to have someone help you when you read instructions, pamphlets, or other written materials from your doctor or pharmacy?: 1 - Never What is the last grade level you completed in school?: Bachelor's degree  Exercise    Diet Patient reports consuming 2 meals a day and 4 snack(s) a day Patient reports that her primary diet is: Regular Patient reports that she does have regular access to food.   Depression Screen PHQ 2/9 Scores 06/22/2021 03/30/2021 10/13/2019 03/10/2019 03/06/2018 09/05/2017 03/05/2017  PHQ - 2 Score 0 0 0 0 0 0 0  PHQ- 9 Score - - - - 2 1 0     Fall Risk Fall Risk  09/01/2021 06/22/2021 03/30/2021 10/13/2019  Falls in the past year? 1 0 0 0  Number falls in past yr: 0 0 - -  Injury with Fall? 0 0 - -  Risk for fall due to : - No Fall Risks - -  Follow up - Falls evaluation completed - -     Objective:  Sabrina Abbott seemed alert and oriented and she participated appropriately during our telephone visit.  Blood Pressure Weight BMI  BP Readings from Last 3 Encounters:  06/22/21 121/76  06/02/21 125/82  03/30/21 132/84   Wt Readings from Last 3 Encounters:  06/22/21 196 lb 12.8 oz (89.3 kg)  06/02/21 188 lb (85.3 kg)  03/30/21 196 lb (88.9 kg)   BMI Readings from Last 1 Encounters:  06/22/21 29.92 kg/m     *Unable to obtain current vital signs, weight, and BMI due to telephone visit type  Hearing/Vision  Sabrina Abbott did not seem to have difficulty with hearing/understanding during the telephone conversation Reports that she has had a formal eye exam by an eye care professional within the past year Reports that she has not had a formal hearing evaluation within the past year *Unable to fully assess hearing and vision during telephone visit type  Cognitive Function: No flowsheet data found. (Normal:0-7, Significant for Dysfunction: >  8)  Normal Cognitive Function Screening: Yes   Immunization & Health Maintenance Record Immunization History  Administered Date(s) Administered   Fluad Quad(high Dose 65+) 03/10/2019, 03/17/2020, 03/30/2021   Influenza, High Dose Seasonal PF 05/09/2015   Influenza,inj,Quad PF,6+ Mos 03/27/2018   Influenza-Unspecified 04/14/2012, 04/28/2013, 05/03/2014, 05/14/2016, 04/02/2017   PFIZER(Purple Top)SARS-COV-2 Vaccination 07/31/2019, 08/18/2019   Pfizer Covid-19 Vaccine Bivalent Booster 26yrs & up 04/08/2020, 10/28/2020   Pneumococcal Polysaccharide-23 03/06/2018   Tdap 07/09/2005   Zoster, Live 12/23/2013    Health Maintenance  Topic Date Due   Zoster Vaccines- Shingrix (1 of 2) Never done   Pneumonia Vaccine 45+ Years old (2 - PCV) 03/07/2019   Fecal DNA (Cologuard)  03/25/2023   MAMMOGRAM  05/11/2023   TETANUS/TDAP  05/09/2026   INFLUENZA VACCINE  Completed   DEXA SCAN  Completed   COVID-19 Vaccine  Completed   Hepatitis C Screening  Completed   HPV VACCINES  Aged Out       Assessment  This is a routine wellness examination for Sabrina Abbott.  Health Maintenance: Due or Overdue Health Maintenance Due  Topic Date Due   Zoster Vaccines- Shingrix (1 of 2) Never done   Pneumonia Vaccine 65+ Years old (2 - PCV) 03/07/2019    Sabrina Abbott does not need a referral for Community Assistance: Care Management:   no Social Work:    no Prescription  Assistance:  no Nutrition/Diabetes Education:  no   Plan:  Personalized Goals  Goals Addressed   None    Personalized Health Maintenance & Screening Recommendations  Pneumococcal vaccine  Shingrix vaccine  Lung Cancer Screening Recommended: no (Low Dose CT Chest recommended if Age 61-80 years, 30 pack-year currently smoking OR have quit w/in past 15 years) Hepatitis C Screening recommended: no HIV Screening recommended: no  Advanced Directives: Written information was not prepared per patient's request.  Referrals & Orders No orders of the defined types were placed in this encounter.   Follow-up Plan Follow-up with Samuel Bouche, NP as planned Schedule your shingrix vaccine at the pharmacy. Pneumonia vaccine can be done in the office. Medicare wellness visit in one year.  Patient will access on mychart.   I have personally reviewed and noted the following in the patients chart:   Medical and social history Use of alcohol, tobacco or illicit drugs  Current medications and supplements Functional ability and status Nutritional status Physical activity Advanced directives List of other physicians Hospitalizations, surgeries, and ER visits in previous 12 months Vitals Screenings to include cognitive, depression, and falls Referrals and appointments  In addition, I have reviewed and discussed with Sabrina Abbott certain preventive protocols, quality metrics, and best practice recommendations. A written personalized care plan for preventive services as well as general preventive health recommendations is available and can be mailed to the patient at her request.      Tinnie Gens, RN  09/04/2021

## 2021-09-04 NOTE — Patient Instructions (Addendum)
Sabrina Abbott Summary and Written Plan of Care  Sabrina Abbott ,  Thank you for allowing me to perform your Medicare Annual Wellness Visit and for your ongoing commitment to your health.   [name] & Immunization History [name]  Topic Date Due   Zoster Vaccines- Shingrix (1 of 2) 12/02/2021 (Originally 05/02/1970)   Pneumonia Vaccine 66+ Years old (2 - PCV) 09/04/2022 (Originally 03/07/2019)   Fecal DNA (Cologuard)  03/25/2023   MAMMOGRAM  05/11/2023   TETANUS/TDAP  05/09/2026   INFLUENZA VACCINE  Completed   DEXA SCAN  Completed   COVID-19 Vaccine  Completed   Hepatitis C Screening  Completed   HPV VACCINES  Aged Out   Immunization History  Administered Date(s) Administered   Fluad Quad(high Dose 65+) 03/10/2019, 03/17/2020, 03/30/2021   Influenza, High Dose Seasonal PF 05/09/2015   Influenza,inj,Quad PF,6+ Mos 03/27/2018   Influenza-Unspecified 04/14/2012, 04/28/2013, 05/03/2014, 05/14/2016, 04/02/2017   PFIZER(Purple Top)SARS-COV-2 Vaccination 07/31/2019, 08/18/2019   Pfizer Covid-19 Vaccine Bivalent Booster 29yrs & up 04/08/2020, 10/28/2020   Pneumococcal Polysaccharide-23 03/06/2018   Tdap 07/09/2005   Zoster, Live 12/23/2013    These are the patient goals that we discussed:  Goals Addressed              This Visit's Progress     DIET - INCREASE WATER INTAKE (pt-stated)          This is a list of [name] Items that are overdue or due now: Pneumococcal vaccine  Shingrix vaccine  Orders/Referrals Placed Today: No orders of the defined types were placed in this encounter.  (Contact our referral department at [phone] if you have not spoken with someone about your referral appointment within the next 5 days)    Follow-up Plan Follow-up with Samuel Bouche, NP as planned Schedule your shingrix vaccine at the pharmacy. Pneumonia vaccine can be done in the office. Medicare  wellness visit in one year.  Patient will access on mychart.      [name], Female Adopting a healthy lifestyle and getting preventive care are important in promoting health and wellness. Ask your health care provider about: The right schedule for you to have regular tests and exams. Things you can do on your own to prevent diseases and keep yourself healthy. What should I know about diet, weight, and exercise? Eat a healthy diet  Eat a diet that includes plenty of vegetables, fruits, low-fat dairy products, and lean protein. Do not eat a lot of foods that are high in solid fats, added sugars, or sodium. Maintain a healthy weight Body mass index (BMI) is used to identify weight problems. It estimates body fat based on height and weight. Your health care provider can help determine your BMI and help you achieve or maintain a healthy weight. Get regular exercise Get regular exercise. This is one of the most important things you can do for your health. Most adults should: Exercise for at least 150 minutes each week. The exercise should increase your heart rate and make you sweat (moderate-intensity exercise). Do strengthening exercises at least twice a week. This is in addition to the moderate-intensity exercise. Spend less time sitting. Even light physical activity can be beneficial. Watch cholesterol and blood lipids Have your blood tested for lipids and cholesterol at 70 years of age, then have this test every 5 years. Have your cholesterol levels checked more often if: Your lipid or cholesterol levels are high. You are older than 70 years of  age. Dennis Bast are at high risk for heart disease. What should I know about cancer screening? Depending on your health history and family history, you may need to have cancer screening at various ages. This may include screening for: Breast cancer. Cervical cancer. Colorectal cancer. Skin cancer. Lung cancer. What should I know about heart  disease, diabetes, and high blood pressure? Blood pressure and heart disease High blood pressure causes heart disease and increases the risk of stroke. This is more likely to develop in people who have high blood pressure readings or are overweight. Have your blood pressure checked: Every 3-5 years if you are 17-15 years of age. Every year if you are 47 years old or older. Diabetes Have regular diabetes screenings. This checks your fasting blood sugar level. Have the screening done: Once every three years after age 23 if you are at a normal weight and have a low risk for diabetes. More often and at a younger age if you are overweight or have a high risk for diabetes. What should I know about preventing infection? Hepatitis B If you have a higher risk for hepatitis B, you should be screened for this virus. Talk with your health care provider to find out if you are at risk for hepatitis B infection. Hepatitis C Testing is recommended for: Everyone born from [birth year] through 1964. Anyone with known risk factors for hepatitis C. Sexually transmitted infections (STIs) Get screened for STIs, including gonorrhea and chlamydia, if: You are sexually active and are younger than 70 years of age. You are older than 70 years of age and your health care provider tells you that you are at risk for this type of infection. Your sexual activity has changed since you were last screened, and you are at increased risk for chlamydia or gonorrhea. Ask your health care provider if you are at risk. Ask your health care provider about whether you are at high risk for HIV. Your health care provider may recommend a prescription medicine to help prevent HIV infection. If you choose to take medicine to prevent HIV, you should first get tested for HIV. You should then be tested every 3 months for as long as you are taking the medicine. Pregnancy If you are about to stop having your period (premenopausal) and you may become  pregnant, seek counseling before you get pregnant. Take 400 to 800 micrograms (mcg) of folic acid every day if you become pregnant. Ask for birth control (contraception) if you want to prevent pregnancy. Osteoporosis and menopause Osteoporosis is a disease in which the bones lose minerals and strength with aging. This can result in bone fractures. If you are 68 years old or older, or if you are at risk for osteoporosis and fractures, ask your health care provider if you should: Be screened for bone loss. Take a calcium or vitamin D supplement to lower your risk of fractures. Be given hormone replacement therapy (HRT) to treat symptoms of menopause. Follow these instructions at home: Alcohol use Do not drink alcohol if: Your health care provider tells you not to drink. You are pregnant, may be pregnant, or are planning to become pregnant. If you drink alcohol: Limit how much you have to: 0-1 drink a day. Know how much alcohol is in your drink. In the [place] one drink equals one 12 oz bottle of beer (355 mL), one 5 oz glass of wine (148 mL), or one 1 oz glass of hard liquor (44 mL). Lifestyle Do not use any products  that contain nicotine or tobacco. These products include cigarettes, chewing tobacco, and vaping devices, such as e-cigarettes. If you need help quitting, ask your health care provider. Do not use street drugs. Do not share needles. Ask your health care provider for help if you need support or information about quitting drugs. General instructions Schedule regular health, dental, and eye exams. Stay current with your vaccines. Tell your health care provider if: You often feel depressed. You have ever been abused or do not feel safe at home. Summary Adopting a healthy lifestyle and getting preventive care are important in promoting health and wellness. Follow your health care provider's instructions about healthy diet, exercising, and getting tested or screened for  diseases. Follow your health care provider's instructions on monitoring your cholesterol and blood pressure. This information is not intended to replace advice given to you by your health care provider. Make sure you discuss any questions you have with your health care provider. Document Revised: 11/14/2020 Document Reviewed: 11/14/2020 Elsevier Patient Education  [employer].

## 2021-10-03 DIAGNOSIS — H5203 Hypermetropia, bilateral: Secondary | ICD-10-CM | POA: Diagnosis not present

## 2021-11-08 ENCOUNTER — Encounter: Payer: Self-pay | Admitting: Medical-Surgical

## 2021-11-09 MED ORDER — AMBULATORY NON FORMULARY MEDICATION: 0 refills | Status: DC

## 2021-12-19 ENCOUNTER — Encounter: Payer: Self-pay | Admitting: Medical-Surgical

## 2021-12-19 MED ORDER — HYDROCHLOROTHIAZIDE 25 MG PO TABS: ORAL_TABLET | Freq: Every day | ORAL | 0 refills | Status: DC

## 2022-01-01 DIAGNOSIS — C50911 Malignant neoplasm of unspecified site of right female breast: Secondary | ICD-10-CM | POA: Diagnosis not present

## 2022-01-31 ENCOUNTER — Ambulatory Visit
Admission: EM | Admit: 2022-01-31 | Discharge: 2022-01-31 | Disposition: A | Discharge status: Home / Self Care | Payer: Medicare HMO | Attending: Family Medicine | Admitting: Family Medicine

## 2022-01-31 ENCOUNTER — Encounter: Payer: Self-pay | Admitting: Emergency Medicine

## 2022-01-31 DIAGNOSIS — R3915 Urgency of urination: Secondary | ICD-10-CM | POA: Diagnosis not present

## 2022-01-31 LAB — POCT URINALYSIS DIP (MANUAL ENTRY)
Bilirubin, UA: NEGATIVE
Glucose, UA: NEGATIVE mg/dL
Ketones, POC UA: NEGATIVE mg/dL
Nitrite, UA: NEGATIVE
Protein Ur, POC: 30 mg/dL — AB
Spec Grav, UA: 1.01 (ref 1.010–1.025)
Urobilinogen, UA: 0.2 E.U./dL
pH, UA: 6 (ref 5.0–8.0)

## 2022-01-31 MED ORDER — NITROFURANTOIN MONOHYD MACRO 100 MG PO CAPS: 100.0000 mg | ORAL_CAPSULE | Freq: Two times a day (BID) | ORAL | 0 refills | Status: DC

## 2022-01-31 NOTE — ED Provider Notes (Signed)
[facility]    CSN: [id number] Arrival date & time: 01/31/22  1616      History   Chief Complaint Chief Complaint  Patient presents with   Urinary Urgency    HPI Sabrina Abbott is a 71 y.o. female.   HPI  Patient states that she has had more bladder infections since she has been through menopause.  She also has more vaginal itching and dryness.  Today she felt normal this morning but as the day has progressed has developed urinary urgency.  She states she feels like she has to urinate every 15 minutes.  Also some dysuria.  No abdominal pain.  No fever.  No vaginal discharge.  No flank pain or kidney problems  Past Medical History:  Diagnosis Date   Allergy 07-09-2013   Seasonal   Blood transfusion without reported diagnosis 12-03-2001   ASCT   Breast cancer ([place])    Cancer ([place])    breast   GERD (gastroesophageal reflux disease)    Hypertension    Leucopenia    Vitamin D deficiency     Patient Active Problem List   Diagnosis Date Noted   Nutritional counseling 10/27/2019   Colon cancer screening 03/25/2017   Elevated liver enzymes 03/05/2017   Leucopenia 03/05/2017   Malignant neoplasm of right breast ([place]) 12/22/2014   Angioedema 07/16/2014   Dermatofibroma of deltoid region 09/25/2012   Elevated blood pressure 05/05/2012   GERD (gastroesophageal reflux disease) 05/05/2012   History of breast cancer 05/05/2012    Past Surgical History:  Procedure Laterality Date   BREAST SURGERY  05-24-2001   Modified rt mastectomy   LIMBAL STEM CELL TRANSPLANT     MASTECTOMY Right    MODIFIED RADICAL MASTECTOMY Right    TUBAL LIGATION     WISDOM TOOTH EXTRACTION      OB History   No obstetric history on file.      Home Medications    Prior to Admission medications   Medication Sig Start Date End Date Taking? Authorizing Provider  AMBULATORY NON FORMULARY MEDICATION Please fit patient for a replacement breast prosthesis.  Fax to: [facility]  [employer] [facility], [address], [place], [place], [place], phone number [phone], fax number [phone] 11/09/21  Yes Samuel Bouche, NP  hydrochlorothiazide (HYDRODIURIL) 25 MG tablet TAKE 1 TABLET (25 MG TOTAL) BY MOUTH DAILY. 12/19/21 12/19/22 Yes Samuel Bouche, NP  nitrofurantoin, macrocrystal-monohydrate, (MACROBID) 100 MG capsule Take 1 capsule (100 mg total) by mouth 2 (two) times daily. 01/31/22  Yes Raylene Everts, MD  RABEprazole (ACIPHEX) 20 MG tablet Take 1 tablet (20 mg total) by mouth daily. Patient taking differently: Take 20 mg by mouth daily. As needed. 12/09/18  Yes Emeterio Reeve, DO  sulfamethoxazole-trimethoprim (BACTRIM DS) 800-160 MG tablet Take 1 tablet by mouth 2 (two) times daily. 06/22/21   Samuel Bouche, NP    Family History Family History  Problem Relation Age of Onset   Heart disease Father    Alcohol abuse Father    Hyperlipidemia Father    Hypertension Father    Stroke Father    Prostate cancer Brother    Diabetes Brother    Hypertension Brother    Obesity Brother    Arthritis Mother    Varicose Veins Mother    Heart disease Sister    Cancer Paternal Aunt    Heart disease Sister     Social History Social History   Tobacco Use   Smoking status: Never  Smokeless tobacco: Never  Vaping Use   Vaping Use: Never used  Substance Use Topics   Alcohol use: No   Drug use: No     Allergies   Iodinated contrast media, Lisinopril, Iodine, and Soy allergy   Review of Systems Review of Systems See HPI  Physical Exam Triage Vital Signs ED Triage Vitals  Enc Vitals Group     BP 01/31/22 1631 119/76     Pulse Rate 01/31/22 1631 77     Resp 01/31/22 1631 18     Temp 01/31/22 1631 99.5 F (37.5 C)     Temp Source 01/31/22 1631 Oral     SpO2 01/31/22 1631 97 %     Weight 01/31/22 1632 190 lb (86.2 kg)     Height 01/31/22 1632 '5\' 8"'$  (1.727 m)     Head Circumference --      Peak Flow --      Pain Score  01/31/22 1632 3     Pain Loc --      Pain Edu? --      Excl. in [place]? --    No data found.  Updated Vital Signs BP 119/76 (BP Location: Left Arm)   Pulse 77   Temp 99.5 F (37.5 C) (Oral)   Resp 18   Ht '5\' 8"'$  (1.727 m)   Wt 86.2 kg   SpO2 97%   BMI 28.89 kg/m      Physical Exam Constitutional:      General: She is not in acute distress.    Appearance: She is well-developed.  HENT:     Head: Normocephalic and atraumatic.  Eyes:     Conjunctiva/sclera: Conjunctivae normal.     Pupils: Pupils are equal, round, and reactive to light.  Cardiovascular:     Rate and Rhythm: Normal rate.  Pulmonary:     Effort: Pulmonary effort is normal. No respiratory distress.  Abdominal:     Tenderness: There is no right CVA tenderness or left CVA tenderness.  Musculoskeletal:        General: Normal range of motion.     Cervical back: Normal range of motion.  Skin:    General: Skin is warm and dry.  Neurological:     Mental Status: She is alert.      UC Treatments / Results  Labs (all labs ordered are listed, but only abnormal results are displayed) Labs Reviewed  POCT URINALYSIS DIP (MANUAL ENTRY) - Abnormal; Notable for the following components:      Result Value   Color, UA light yellow (*)    Clarity, UA cloudy (*)    Blood, UA large (*)    Protein Ur, POC =30 (*)    Leukocytes, UA Large (3+) (*)    All other components within normal limits  URINE CULTURE    EKG   Radiology No results found.  Procedures Procedures (including critical care time)  Medications Ordered in UC Medications - No data to display  Initial Impression / Assessment and Plan / UC Course  I have reviewed the triage vital signs and the nursing notes.  Pertinent labs & imaging results that were available during my care of the patient were reviewed by me and considered in my medical decision making (see chart for details).     Final Clinical Impressions(s) / UC Diagnoses   Final  diagnoses:  Urinary urgency     Discharge Instructions      Drink plenty of water Take the antibiotic 2  times a day for 5 days Your urine has been sent to the laboratory for culture.  If you need a change in antibiotics, a nurse will call you   ED Prescriptions     Medication Sig Dispense Auth. Provider   nitrofurantoin, macrocrystal-monohydrate, (MACROBID) 100 MG capsule Take 1 capsule (100 mg total) by mouth 2 (two) times daily. 10 capsule Raylene Everts, MD      PDMP not reviewed this encounter.   Raylene Everts, MD 01/31/22 [phone]

## 2022-01-31 NOTE — Discharge Instructions (Signed)
Drink plenty of water Take the antibiotic 2 times a day for 5 days Your urine has been sent to the laboratory for culture.  If you need a change in antibiotics, a nurse will call you

## 2022-01-31 NOTE — ED Triage Notes (Signed)
Patient c/o urinary urgency, small amount of blood when wiping, frequency that started today.  Some LLQ pain.  Patient denies any OTC pain meds.

## 2022-02-02 LAB — URINE CULTURE

## 2022-02-28 ENCOUNTER — Other Ambulatory Visit: Payer: Self-pay | Admitting: Family Medicine

## 2022-02-28 NOTE — Telephone Encounter (Signed)
Patient has been scheduled for next available appt on 03/29/22 & stated she would need a refill on medication up until appt date. AMUCK

## 2022-02-28 NOTE — Telephone Encounter (Signed)
Patient needs follow up appointment for further refills. Please contact her to facilitate scheduling.

## 2022-03-27 ENCOUNTER — Other Ambulatory Visit: Payer: Self-pay | Admitting: Medical-Surgical

## 2022-03-27 DIAGNOSIS — Z1231 Encounter for screening mammogram for malignant neoplasm of breast: Secondary | ICD-10-CM

## 2022-03-29 ENCOUNTER — Encounter: Payer: Self-pay | Admitting: Medical-Surgical

## 2022-03-29 ENCOUNTER — Ambulatory Visit (INDEPENDENT_AMBULATORY_CARE_PROVIDER_SITE_OTHER): Payer: Medicare HMO | Admitting: Medical-Surgical

## 2022-03-29 VITALS — BP 135/77 | HR 69 | Resp 20 | Ht 68.0 in | Wt 192.7 lb

## 2022-03-29 DIAGNOSIS — I10 Essential (primary) hypertension: Secondary | ICD-10-CM

## 2022-03-29 DIAGNOSIS — R051 Acute cough: Secondary | ICD-10-CM

## 2022-03-29 MED ORDER — HYDROCHLOROTHIAZIDE 25 MG PO TABS: 25.0000 mg | ORAL_TABLET | Freq: Every day | ORAL | 0 refills | Status: DC

## 2022-03-29 MED ORDER — METHYLPREDNISOLONE 4 MG PO TBPK: ORAL_TABLET | ORAL | 0 refills | Status: DC

## 2022-03-29 NOTE — Progress Notes (Signed)
Established Patient Office Visit  Subjective   Patient ID: Sabrina Abbott, female   DOB: 1951-01-23 Age: 71 y.o. MRN: [id number]   Chief Complaint  Patient presents with   Follow-up   Medication Refill    HPI Pleasant 71 year old female presenting today for the following:  Cough: Reports approximately 3 weeks of difficulty with coughing.  This started when she was exposed to cigarette smoke for greater than 1 hour.  She had a couple of days of rhinorrhea but this quickly resolved.  Unfortunately the cough has been persistent.  Notes that is worse at night as well as in the mornings.  She does cough up small amounts of clear sputum.  Notes that at one point, she felt like she was wheezing.  No fevers, chills, or chest pain however she does note that her chest is sore from all the coughing.  No previous history of asthma.  Hypertension: Taking HCTZ 25 mg daily, tolerating well without side effects.  Has been monitoring her blood pressure at home reports that her systolics often run in the 209O with her diastolics in the 70J however with her recent cough and symptoms, she has noted diastolics in the upper 62E/ZMO 90s.  She does continue to add salt to certain foods.  She is physically active and walks daily at a local park. Denies CP, SOB, palpitations, lower extremity edema, dizziness, headaches, or vision changes.  Objective:    Vitals:   03/29/22 1451  BP: 135/77  Pulse: 69  Resp: 20  Height: '5\' 8"'$  (1.727 m)  Weight: 192 lb 11.2 oz (87.4 kg)  SpO2: 98%  BMI (Calculated): 29.31   Physical Exam Vitals and nursing note reviewed.  Constitutional:      General: She is not in acute distress.    Appearance: Normal appearance. She is not ill-appearing.  HENT:     Head: Normocephalic and atraumatic.  Cardiovascular:     Rate and Rhythm: Normal rate and regular rhythm.     Pulses: Normal pulses.     Heart sounds: Normal heart sounds.  Pulmonary:     Effort: Pulmonary effort is  normal. No respiratory distress.     Breath sounds: Normal breath sounds. No wheezing, rhonchi or rales.  Skin:    General: Skin is warm and dry.  Neurological:     Mental Status: She is alert and oriented to person, place, and time.  Psychiatric:        Mood and Affect: Mood normal.        Behavior: Behavior normal.        Thought Content: Thought content normal.        Judgment: Judgment normal.   No results found for this or any previous visit (from the past 24 hour(s)).     The 10-year ASCVD risk score (Arnett DK, et al., 2019) is: 12.2%   Values used to calculate the score:     Age: 42 years     Sex: Female     Is Non-Hispanic African American: Yes     Diabetic: No     Tobacco smoker: No     Systolic Blood Pressure: 294 mmHg     Is BP treated: Yes     HDL Cholesterol: 63 mg/dL     Total Cholesterol: 192 mg/dL   Assessment & Plan:   1. Acute cough Unclear etiology.  Consider cough variant asthma, postviral syndrome, or reactive airway disease.  Exam benign today with no abnormal lung sounds.  We will go ahead and treat with Medrol Dosepak to reduce inflammation.  No indication for antibiotic therapy at this time.  Offered chest x-ray but we will hold off and see if the steroids are helpful.  If not, she will let me know and we can proceed with imaging.  2. Primary hypertension Checking labs as below.  Blood pressure looks good here in our office today.  Continue hydrochlorothiazide 25 mg daily as prescribed.  Monitor blood pressure at home with a goal of 130/80 or less.  Recommend a low-sodium diet.  Stay physically active on a regular basis and aim for weight loss to healthy weight. - CBC with Differential/Platelet - COMPLETE METABOLIC PANEL WITH GFR - Lipid panel  Return in about 6 months (around 09/27/2022) for HTN follow up.  ___________________________________________ Clearnce Sorrel, DNP, APRN, FNP-BC [facility] [place]

## 2022-04-02 DIAGNOSIS — I10 Essential (primary) hypertension: Secondary | ICD-10-CM | POA: Diagnosis not present

## 2022-04-03 ENCOUNTER — Encounter: Payer: Self-pay | Admitting: Medical-Surgical

## 2022-04-03 LAB — LIPID PANEL
Cholesterol: 185 mg/dL (ref <200)
HDL: 57 mg/dL (ref >=50)
LDL Cholesterol (Calc): 109 mg/dL (calc) — ABNORMAL HIGH
Non-HDL Cholesterol (Calc): 128 mg/dL (calc) (ref <130)
Total CHOL/HDL Ratio: 3.2 (calc) (ref <5.0)
Triglycerides: 96 mg/dL (ref <150)

## 2022-04-03 LAB — COMPLETE METABOLIC PANEL WITH GFR
AG Ratio: 1.4 (calc) (ref 1.0–2.5)
ALT: 16 U/L (ref 6–29)
AST: 19 U/L (ref 10–35)
Albumin: 4.2 g/dL (ref 3.6–5.1)
Alkaline phosphatase (APISO): 81 U/L (ref 37–153)
BUN: 16 mg/dL (ref 7–25)
CO2: 29 mmol/L (ref 20–32)
Calcium: 9.5 mg/dL (ref 8.6–10.4)
Chloride: 104 mmol/L (ref 98–110)
Creat: 0.95 mg/dL (ref 0.50–1.05)
Globulin: 2.9 g/dL (calc) (ref 1.9–3.7)
Glucose, Bld: 71 mg/dL (ref 65–99)
Potassium: 4.7 mmol/L (ref 3.5–5.3)
Sodium: 140 mmol/L (ref 135–146)
Total Bilirubin: 0.3 mg/dL (ref 0.2–1.2)
Total Protein: 7.1 g/dL (ref 6.1–8.1)
eGFR: 65 mL/min/{1.73_m2} (ref >=60)

## 2022-04-03 LAB — CBC WITH DIFFERENTIAL/PLATELET
Absolute Monocytes: 448 cells/uL (ref 200–950)
Basophils Absolute: 11 cells/uL (ref 0–200)
Basophils Relative: 0.3 %
Eosinophils Absolute: 110 cells/uL (ref 15–500)
Eosinophils Relative: 2.9 %
HCT: 36.8 % (ref 35.0–45.0)
Hemoglobin: 12.3 g/dL (ref 11.7–15.5)
Lymphs Abs: 1322 cells/uL (ref 850–3900)
MCH: 28.9 pg (ref 27.0–33.0)
MCHC: 33.4 g/dL (ref 32.0–36.0)
MCV: 86.4 fL (ref 80.0–100.0)
MPV: 11 fL (ref 7.5–12.5)
Monocytes Relative: 11.8 %
Neutro Abs: 1908 cells/uL (ref 1500–7800)
Neutrophils Relative %: 50.2 %
Platelets: 195 10*3/uL (ref 140–400)
RBC: 4.26 10*6/uL (ref 3.80–5.10)
RDW: 13.2 % (ref 11.0–15.0)
Total Lymphocyte: 34.8 %
WBC: 3.8 10*3/uL (ref 3.8–10.8)

## 2022-04-21 ENCOUNTER — Other Ambulatory Visit: Payer: Self-pay | Admitting: Medical-Surgical

## 2022-05-05 ENCOUNTER — Other Ambulatory Visit: Payer: Self-pay | Admitting: Medical-Surgical

## 2022-05-17 ENCOUNTER — Other Ambulatory Visit: Payer: Self-pay | Admitting: Medical-Surgical

## 2022-05-17 ENCOUNTER — Ambulatory Visit (INDEPENDENT_AMBULATORY_CARE_PROVIDER_SITE_OTHER): Payer: Medicare HMO

## 2022-05-17 ENCOUNTER — Telehealth: Payer: Self-pay

## 2022-05-17 DIAGNOSIS — Z1231 Encounter for screening mammogram for malignant neoplasm of breast: Secondary | ICD-10-CM

## 2022-05-17 DIAGNOSIS — Z9011 Acquired absence of right breast and nipple: Secondary | ICD-10-CM | POA: Diagnosis not present

## 2022-05-17 NOTE — Telephone Encounter (Signed)
Patient dropped off Healthcare POA. I've placed paperwork in the basket. tvt

## 2022-05-21 ENCOUNTER — Other Ambulatory Visit: Payer: Self-pay | Admitting: Medical-Surgical

## 2022-05-21 ENCOUNTER — Encounter: Payer: Self-pay | Admitting: Medical-Surgical

## 2022-05-21 DIAGNOSIS — R928 Other abnormal and inconclusive findings on diagnostic imaging of breast: Secondary | ICD-10-CM

## 2022-06-08 ENCOUNTER — Ambulatory Visit: Payer: Medicare HMO

## 2022-06-08 ENCOUNTER — Ambulatory Visit
Admission: RE | Admit: 2022-06-08 | Discharge: 2022-06-08 | Disposition: A | Discharge status: Home / Self Care | Payer: Medicare HMO | Source: Ambulatory Visit | Attending: Medical-Surgical | Admitting: Medical-Surgical

## 2022-06-08 DIAGNOSIS — R928 Other abnormal and inconclusive findings on diagnostic imaging of breast: Secondary | ICD-10-CM

## 2022-06-11 ENCOUNTER — Encounter: Payer: Self-pay | Admitting: Medical-Surgical

## 2022-06-11 MED ORDER — SCOPOLAMINE 1 MG/3DAYS TD PT72: 1.0000 | MEDICATED_PATCH | TRANSDERMAL | 0 refills | Status: DC

## 2022-06-11 NOTE — Telephone Encounter (Signed)
Pended Scopolamine patches, sign if appropriate.

## 2022-06-25 ENCOUNTER — Encounter: Payer: Self-pay | Admitting: Medical-Surgical

## 2022-07-03 DIAGNOSIS — C50911 Malignant neoplasm of unspecified site of right female breast: Secondary | ICD-10-CM | POA: Diagnosis not present

## 2022-08-16 ENCOUNTER — Encounter: Payer: Self-pay | Admitting: Medical-Surgical

## 2022-08-16 ENCOUNTER — Ambulatory Visit (INDEPENDENT_AMBULATORY_CARE_PROVIDER_SITE_OTHER): Payer: Medicare HMO | Admitting: Medical-Surgical

## 2022-08-16 VITALS — BP 145/83 | HR 72 | Resp 20 | Ht 68.0 in | Wt 192.8 lb

## 2022-08-16 DIAGNOSIS — I10 Essential (primary) hypertension: Secondary | ICD-10-CM | POA: Diagnosis not present

## 2022-08-16 MED ORDER — CHLORTHALIDONE 25 MG PO TABS: 25.0000 mg | ORAL_TABLET | Freq: Every day | ORAL | 0 refills | Status: DC

## 2022-08-16 MED ORDER — RABEPRAZOLE SODIUM 20 MG PO TBEC: 20.0000 mg | DELAYED_RELEASE_TABLET | Freq: Every day | ORAL | 3 refills | Status: AC

## 2022-08-16 NOTE — Progress Notes (Signed)
Established Patient Office Visit  Subjective   Patient ID: Sabrina Abbott, female   DOB: [birth year] Age: 72 y.o. MRN: [id number]   Chief Complaint  Patient presents with   Hypertension    HPI Very pleasant 72 year old female presenting today for discussion of hypertension.  Has been taking hydrochlorothiazide 25 mg daily, tolerating well without side effects.  Notes that she went on a trip and when she came back in January, found that her blood pressure was elevated.  Since then, she has been monitoring her blood pressure at home and reports that it has remained elevated similar to her blood pressure on arrival today.  The highest she has seen is 160/98.  She follows a low-sodium diet and works to drink water is much as possible.  Averages about 40 ounces of water per day.  Notes that she can do better than this but this seems to be what she manages on average.  Has been having some headaches in association with elevated blood pressure readings.  When this happened, she got worried and doubled up on her hydrochlorothiazide to take 50 mg/day.  Feels that the hydrochlorothiazide at 50 mg/day brought her blood pressure down a little bit better than the prescribed dose did.  Denies chest pain, shortness of breath, dizziness, lower extremity edema, and syncopal spells.   Objective:    Vitals:   08/16/22 1349 08/16/22 1351  BP: (!) 145/83 (!) 145/83  Pulse: 72 72  Resp: 20   Height: '5\' 8"'$  (1.727 m)   Weight: 192 lb 12.8 oz (87.5 kg)   SpO2: 100% 100%  BMI (Calculated): 29.32     Physical Exam Vitals and nursing note reviewed.  Constitutional:      General: She is not in acute distress.    Appearance: Normal appearance. She is not ill-appearing.  HENT:     Head: Normocephalic and atraumatic.  Cardiovascular:     Rate and Rhythm: Normal rate and regular rhythm.     Pulses: Normal pulses.     Heart sounds: Normal heart sounds.  Pulmonary:     Effort: Pulmonary effort is normal. No  respiratory distress.     Breath sounds: Normal breath sounds. No wheezing, rhonchi or rales.  Skin:    General: Skin is warm and dry.  Neurological:     Mental Status: She is alert and oriented to person, place, and time.  Psychiatric:        Mood and Affect: Mood normal.        Behavior: Behavior normal.        Thought Content: Thought content normal.        Judgment: Judgment normal.   No results found for this or any previous visit (from the past 24 hour(s)).     The 10-year ASCVD risk score (Arnett DK, et al., 2019) is: 14.1%   Values used to calculate the score:     Age: 52 years     Sex: Female     Is Non-Hispanic African American: Yes     Diabetic: No     Tobacco smoker: No     Systolic Blood Pressure: 433 mmHg     Is BP treated: Yes     HDL Cholesterol: 57 mg/dL     Total Cholesterol: 185 mg/dL   Assessment & Plan:   1. Primary hypertension Blood pressure is elevated above recommended goal.  Discontinue hydrochlorothiazide.  Start chlorthalidone 25 mg daily.  Monitor blood pressure at home with a  goal of 130/80 or less.  Blood pressure cuff used at home was brought with her today and this was verified for accuracy.  Continue following a low-sodium diet but make sure to evaluate for hidden sodium in frequently present foods.  Recommend working to increase regular intentional exercise with a goal for weight loss to healthy weight.  Plan to return in 2 weeks for nurse visit for blood pressure check.  At that time, draw BMP to evaluate kidney function and electrolytes on the new medication. - BASIC METABOLIC PANEL WITH GFR   Return in about 2 weeks (around 08/30/2022) for nurse visit for BP check; lab drawn in 2 weeks (order in).  ___________________________________________ Clearnce Sorrel, DNP, APRN, FNP-BC [facility] [place]

## 2022-08-30 ENCOUNTER — Ambulatory Visit (INDEPENDENT_AMBULATORY_CARE_PROVIDER_SITE_OTHER): Payer: Medicare HMO

## 2022-08-30 ENCOUNTER — Encounter: Payer: Self-pay | Admitting: Medical-Surgical

## 2022-08-30 ENCOUNTER — Telehealth: Payer: Self-pay

## 2022-08-30 ENCOUNTER — Ambulatory Visit: Payer: Medicare HMO | Admitting: Podiatry

## 2022-08-30 ENCOUNTER — Encounter: Payer: Self-pay | Admitting: Podiatry

## 2022-08-30 ENCOUNTER — Ambulatory Visit (INDEPENDENT_AMBULATORY_CARE_PROVIDER_SITE_OTHER): Payer: Medicare HMO | Admitting: Medical-Surgical

## 2022-08-30 VITALS — BP 133/67 | HR 64 | Ht 68.0 in

## 2022-08-30 DIAGNOSIS — I10 Essential (primary) hypertension: Secondary | ICD-10-CM

## 2022-08-30 DIAGNOSIS — M76821 Posterior tibial tendinitis, right leg: Secondary | ICD-10-CM

## 2022-08-30 DIAGNOSIS — M25571 Pain in right ankle and joints of right foot: Secondary | ICD-10-CM | POA: Diagnosis not present

## 2022-08-30 MED ORDER — AMLODIPINE BESYLATE 2.5 MG PO TABS: 2.5000 mg | ORAL_TABLET | Freq: Every day | ORAL | 0 refills | Status: DC

## 2022-08-30 MED ORDER — MELOXICAM 15 MG PO TABS: 15.0000 mg | ORAL_TABLET | Freq: Every day | ORAL | 0 refills | Status: DC

## 2022-08-30 NOTE — Progress Notes (Signed)
Amlodipine 2.65m daily sent to local pharmacy on file. Agree with documentation as below.   ___________________________________________ JClearnce Sorrel DNP, APRN, FNP-BC [facility] [place]

## 2022-08-30 NOTE — Patient Instructions (Signed)
Per Samuel Bouche, NP - start Amlodipine 2.52m  once daily. Recheck BP at nurse visit in 2 weeks. Bring in home BP cuff for comparison.

## 2022-08-30 NOTE — Patient Instructions (Signed)
Posterior Tibial Tendinitis Rehab Ask your health care provider which exercises are safe for you. Do exercises exactly as told by your health care provider and adjust them as directed. It is normal to feel mild stretching, pulling, tightness, or discomfort as you do these exercises. Stop right away if you feel sudden pain or your pain gets worse. Do not begin these exercises until told by your health care provider. Stretching and range-of-motion exercises These exercises warm up your muscles and joints and improve the movement and flexibility in your ankle and foot. These exercises may also help to relieve pain. Standing wall calf stretch, knee straight  Stand with your hands against a wall. Extend your left / right leg behind you, and bend your front knee slightly. If directed, place a folded washcloth under the arch of your foot for support. Point the toes of your back foot slightly inward. Keeping your heels on the floor and your back knee straight, shift your weight toward the wall. Do not allow your back to arch. You should feel a gentle stretch in your upper left / right calf. Hold this position for __________ seconds. Repeat __________ times. Complete this exercise __________ times a day. Standing wall calf stretch, knee bent Stand with your hands against a wall. Extend your left / right leg behind you, and bend your front knee slightly. If directed, place a folded washcloth under the arch of your foot for support. Point the toes of your back foot slightly inward. Unlock your back knee so it is bent. Keep your heels on the floor. You should feel a gentle stretch deep in your lower left / right calf. Hold this position for __________ seconds. Repeat __________ times. Complete this exercise __________ times a day. Strengthening exercises These exercises build strength and endurance in your ankle and foot. Endurance is the ability to use your muscles for a long time, even after they get  tired. Ankle inversion with band Secure one end of a rubber exercise band or tubing to a fixed object, such as a table leg or a pole, that will stay still when the band is pulled. Loop the other end of the band around the middle of your left / right foot. Sit on the floor facing the object with your left / right leg extended. The band or tube should be slightly tense when your foot is relaxed. Leading with your big toe, slowly bring your left / right foot and ankle inward, toward your other foot (inversion). Hold this position for __________ seconds. Slowly return your foot to the starting position. Repeat __________ times. Complete this exercise __________ times a day. Towel curls  Sit in a chair on a non-carpeted surface, and put your feet on the floor. Place a towel in front of your feet. If told by your health care provider, add a __________ weight to the end of the towel. Keeping your heel on the floor, put your left / right foot on the towel. Pull the towel toward you by grabbing the towel with your toes and curling them under. Keep your heel on the floor while you do this. Let your toes relax. Grab the towel with your toes again. Keep going until the towel is completely underneath your foot. Repeat __________ times. Complete this exercise __________ times a day. Balance exercise This exercise improves or maintains your balance. Balance is important in preventing falls. Single leg stand Without wearing shoes, stand near a railing or in a doorway. You may hold   on to the railing or door frame as needed for balance. Stand on your left / right foot. Keep your big toe down on the floor and try to keep your arch lifted. If balancing in this position is too easy, try the exercise with your eyes closed or while standing on a pillow. Hold this position for __________ seconds. Repeat __________ times. Complete this exercise __________ times a day. This information is not intended to replace  advice given to you by your health care provider. Make sure you discuss any questions you have with your health care provider. Document Revised: 10/21/2018 Document Reviewed: 08/27/2018 Elsevier Patient Education  2023 [employer]  

## 2022-08-30 NOTE — Progress Notes (Signed)
   Established Patient Office Visit  Subjective   Patient ID: Sabrina Abbott, female    DOB: 10/02/1950  Age: 72 y.o. MRN: [id number]  Chief Complaint  Patient presents with   Hypertension    BP check - nurse visit     HPI  Hypertension- BP check nurse visit - patient brought in home readings  of 142/84, 143/86, 148/91, 149/85, 139/84, 144/85,139/82-  patient states she has had symptoms of  nausea, vomiting diarrhea that started Saturday 08/25/21 completely resolved on  Wednesday 08/29/22- she mentioned some chest pressure on Tuesday 08/28/22 but felt 70% sure this was related to gas pressure.  ROS    Objective:     BP 133/67   Pulse 64   Ht 5' 8"$  (1.727 m)   SpO2 100%   BMI 29.32 kg/m    Physical Exam   No results found for any visits on 08/30/22.    The 10-year ASCVD risk score (Arnett DK, et al., 2019) is: 12%    Assessment & Plan:  Hypertension- BP check nurse visit-  initial reading in office 139/70 and second reading 133/67.   Per Samuel Bouche, NP - patient should start Amlodipine 2.51m  and schedule a nurse visit recheck in 2 weeks and will check home BP cuff comparison at that time.  Problem List Items Addressed This Visit       Cardiovascular and Mediastinum   Hypertension - Primary    Return in about 2 weeks (around 09/13/2022) for nurse visit BP check - bring home BP cuff for comparison.    KRae Lips LPN

## 2022-08-30 NOTE — Telephone Encounter (Signed)
Patient is agreeable to starting Amlodipine 2.56m.

## 2022-08-30 NOTE — Progress Notes (Signed)
  Subjective:  Patient ID: Sabrina Abbott, female    DOB: Feb 23, 1951,   MRN: [id number]  Chief Complaint  Patient presents with   Ankle Pain    Right ankle pain constant pain going into arch/heel area. On going for years    72 y.o. female presents for right foot and ankle pain that has been going on for years. States it started about 5 years ago and she iced and got new shoes and got better but recently started getting worse again. She has been icing and she wears brooks and Superfeet but still having trouble.  Denies any other pedal complaints. Denies n/v/f/c.   Past Medical History:  Diagnosis Date   Allergy 07-09-2013   Seasonal   Blood transfusion without reported diagnosis 12-03-2001   ASCT   Breast cancer ([place])    Cancer ([place])    breast   GERD (gastroesophageal reflux disease)    Hypertension    Leucopenia    Vitamin D deficiency     Objective:  Physical Exam: Vascular: DP/PT pulses 2/4 bilateral. CFT <3 seconds. Normal hair growth on digits. No edema.  Skin. No lacerations or abrasions bilateral feet.  Musculoskeletal: MMT 5/5 bilateral lower extremities in DF, PF, Inversion and Eversion. Deceased ROM in DF of ankle joint.  Tender to insertion of PT tendon and proximally along course to medial malleolus. Pain with PF and inversion of the foot. Unable to perform single limb heel rise. Pes planus noted with too many toes sign.  Neurological: Sensation intact to light touch.   Assessment:   1. Posterior tibial tendon dysfunction (PTTD) of right lower extremity     Plan:  Patient was evaluated and treated and all questions answered. X-rays reviewed and discussed with patient. Pes planus noted to right foot with collapse of medial arch. No acute fractures or dislocations noted.  Discussed PTTD diagnosis and treatment options with patient. Stretching exercises discussed and handout dispensed. Prescription for meloxicam provided. Reviewed CMP.  Tri-Lock ankle brace  dispensed. Discussed if there is no improvement PT/MRI/injection may be an option. Patient to return to clinic in 6 to 8 weeks or sooner if symptoms fail to improve or worsen.   Lorenda Peck, DPM

## 2022-08-31 LAB — BASIC METABOLIC PANEL WITH GFR
BUN: 21 mg/dL (ref 7–25)
CO2: 30 mmol/L (ref 20–32)
Calcium: 9.7 mg/dL (ref 8.6–10.4)
Chloride: 100 mmol/L (ref 98–110)
Creat: 0.94 mg/dL (ref 0.60–1.00)
Glucose, Bld: 85 mg/dL (ref 65–99)
Potassium: 4 mmol/L (ref 3.5–5.3)
Sodium: 140 mmol/L (ref 135–146)
eGFR: 65 mL/min/{1.73_m2} (ref >=60)

## 2022-09-08 ENCOUNTER — Other Ambulatory Visit: Payer: Self-pay | Admitting: Medical-Surgical

## 2022-09-10 ENCOUNTER — Ambulatory Visit (INDEPENDENT_AMBULATORY_CARE_PROVIDER_SITE_OTHER): Payer: Medicare HMO | Admitting: Medical-Surgical

## 2022-09-10 DIAGNOSIS — Z0000 Encounter for general adult medical examination without abnormal findings: Secondary | ICD-10-CM

## 2022-09-10 NOTE — Patient Instructions (Addendum)
[facility] Maintenance Summary and Written Plan of Care  Ms. Sabrina Abbott ,  Thank you for allowing me to perform your Medicare Annual Wellness Visit and for your ongoing commitment to your health.   Health Maintenance & Immunization History Health Maintenance  Topic Date Due   DTaP/Tdap/Td (2 - Td or Tdap) 07/10/2015   COVID-19 Vaccine (6 - 2023-24 season) 09/26/2022 (Originally 08/10/2022)   Pneumonia Vaccine 26+ Years old (2 of 2 - PCV) 09/10/2023 (Originally 03/07/2019)   Fecal DNA (Cologuard)  03/25/2023   Medicare Annual Wellness (AWV)  09/10/2023   MAMMOGRAM  05/17/2024   INFLUENZA VACCINE  Completed   DEXA SCAN  Completed   Hepatitis C Screening  Completed   HPV VACCINES  Aged Out   Zoster Vaccines- Shingrix  Discontinued   Immunization History  Administered Date(s) Administered   Fluad Quad(high Dose 65+) 03/10/2019, 03/17/2020, 03/30/2021   Influenza, High Dose Seasonal PF 05/09/2015   Influenza,inj,Quad PF,6+ Mos 03/27/2018   Influenza-Unspecified 04/14/2012, 04/28/2013, 05/03/2014, 05/14/2016, 04/02/2017, 05/22/2022   PFIZER(Purple Top)SARS-COV-2 Vaccination 07/31/2019, 08/18/2019   Pfizer Covid-19 Vaccine Bivalent Booster 62yr & up 04/08/2020, 10/28/2020, 06/15/2022   Pneumococcal Polysaccharide-23 03/06/2018   Tdap 07/09/2005   Zoster, Live 12/23/2013    These are the patient goals that we discussed:  Goals Addressed               This Visit's Progress     Patient Stated (pt-stated)        Patient stated that she would like to continue to maintain her weight.         This is a list of Health Maintenance Items that are overdue or due now: Health Maintenance Due  Topic Date Due   DTaP/Tdap/Td (2 - Td or Tdap) 07/10/2015   Pneumococcal vaccine  Cologuard due in September, 2024.  Orders/Referrals Placed Today: No orders of the defined types were placed in this encounter.  (Contact our referral department at [phone] you  have not spoken with someone about your referral appointment within the next 5 days)    Follow-up Plan Follow-up with JSamuel Bouche NP as planned Discuss pneumonia with PCP at the next appointment Schedule tetanus vaccine at the pharmacy.  Medicare wellness visit in one year. Patient will access AVS on my chart.     Health Maintenance, Female Adopting a healthy lifestyle and getting preventive care are important in promoting health and wellness. Ask your health care provider about: The right schedule for you to have regular tests and exams. Things you can do on your own to prevent diseases and keep yourself healthy. What should I know about diet, weight, and exercise? Eat a healthy diet  Eat a diet that includes plenty of vegetables, fruits, low-fat dairy products, and lean protein. Do not eat a lot of foods that are high in solid fats, added sugars, or sodium. Maintain a healthy weight Body mass index (BMI) is used to identify weight problems. It estimates body fat based on height and weight. Your health care provider can help determine your BMI and help you achieve or maintain a healthy weight. Get regular exercise Get regular exercise. This is one of the most important things you can do for your health. Most adults should: Exercise for at least 150 minutes each week. The exercise should increase your heart rate and make you sweat (moderate-intensity exercise). Do strengthening exercises at least twice a week. This is in addition to the moderate-intensity exercise. Spend less time sitting.  Even light physical activity can be beneficial. Watch cholesterol and blood lipids Have your blood tested for lipids and cholesterol at 72 years of age, then have this test every 5 years. Have your cholesterol levels checked more often if: Your lipid or cholesterol levels are high. You are older than 71 years of age. You are at high risk for heart disease. What should I know about cancer  screening? Depending on your health history and family history, you may need to have cancer screening at various ages. This may include screening for: Breast cancer. Cervical cancer. Colorectal cancer. Skin cancer. Lung cancer. What should I know about heart disease, diabetes, and high blood pressure? Blood pressure and heart disease High blood pressure causes heart disease and increases the risk of stroke. This is more likely to develop in people who have high blood pressure readings or are overweight. Have your blood pressure checked: Every 3-5 years if you are 66-55 years of age. Every year if you are 3 years old or older. Diabetes Have regular diabetes screenings. This checks your fasting blood sugar level. Have the screening done: Once every three years after age 60 if you are at a normal weight and have a low risk for diabetes. More often and at a younger age if you are overweight or have a high risk for diabetes. What should I know about preventing infection? Hepatitis B If you have a higher risk for hepatitis B, you should be screened for this virus. Talk with your health care provider to find out if you are at risk for hepatitis B infection. Hepatitis C Testing is recommended for: Everyone born from [birth year] through 1964. Anyone with known risk factors for hepatitis C. Sexually transmitted infections (STIs) Get screened for STIs, including gonorrhea and chlamydia, if: You are sexually active and are younger than 71 years of age. You are older than 71 years of age and your health care provider tells you that you are at risk for this type of infection. Your sexual activity has changed since you were last screened, and you are at increased risk for chlamydia or gonorrhea. Ask your health care provider if you are at risk. Ask your health care provider about whether you are at high risk for HIV. Your health care provider may recommend a prescription medicine to help prevent HIV  infection. If you choose to take medicine to prevent HIV, you should first get tested for HIV. You should then be tested every 3 months for as long as you are taking the medicine. Pregnancy If you are about to stop having your period (premenopausal) and you may become pregnant, seek counseling before you get pregnant. Take 400 to 800 micrograms (mcg) of folic acid every day if you become pregnant. Ask for birth control (contraception) if you want to prevent pregnancy. Osteoporosis and menopause Osteoporosis is a disease in which the bones lose minerals and strength with aging. This can result in bone fractures. If you are 18 years old or older, or if you are at risk for osteoporosis and fractures, ask your health care provider if you should: Be screened for bone loss. Take a calcium or vitamin D supplement to lower your risk of fractures. Be given hormone replacement therapy (HRT) to treat symptoms of menopause. Follow these instructions at home: Alcohol use Do not drink alcohol if: Your health care provider tells you not to drink. You are pregnant, may be pregnant, or are planning to become pregnant. If you drink alcohol: Limit how  much you have to: 0-1 drink a day. Know how much alcohol is in your drink. In the [place] one drink equals one 12 oz bottle of beer (355 mL), one 5 oz glass of wine (148 mL), or one 1 oz glass of hard liquor (44 mL). Lifestyle Do not use any products that contain nicotine or tobacco. These products include cigarettes, chewing tobacco, and vaping devices, such as e-cigarettes. If you need help quitting, ask your health care provider. Do not use street drugs. Do not share needles. Ask your health care provider for help if you need support or information about quitting drugs. General instructions Schedule regular health, dental, and eye exams. Stay current with your vaccines. Tell your health care provider if: You often feel depressed. You have ever been abused  or do not feel safe at home. Summary Adopting a healthy lifestyle and getting preventive care are important in promoting health and wellness. Follow your health care provider's instructions about healthy diet, exercising, and getting tested or screened for diseases. Follow your health care provider's instructions on monitoring your cholesterol and blood pressure. This information is not intended to replace advice given to you by your health care provider. Make sure you discuss any questions you have with your health care provider. Document Revised: 11/14/2020 Document Reviewed: 11/14/2020 Elsevier Patient Education  [place].

## 2022-09-10 NOTE — Progress Notes (Signed)
MEDICARE ANNUAL WELLNESS VISIT  09/10/2022  Telephone Visit Disclaimer This Medicare AWV was conducted by telephone due to national recommendations for restrictions regarding the COVID-19 Pandemic (e.g. social distancing).  I verified, using two identifiers, that I am speaking with Sabrina Abbott or their authorized healthcare agent. I discussed the limitations, risks, security, and privacy concerns of performing an evaluation and management service by telephone and the potential availability of an in-person appointment in the future. The patient expressed understanding and agreed to proceed.  Location of Patient: Home Location of Provider (nurse):  In the office.  Subjective:    Sabrina Abbott is a 72 y.o. female patient of Samuel Bouche, NP who had a Medicare Annual Wellness Visit today via telephone. Sabrina Abbott is Retired and lives with their daughter. she has 2 children. she reports that she is socially active and does interact with friends/family regularly. she is moderately physically active and enjoys baking and socializing with her friends.  Patient Care Team: Samuel Bouche, NP as PCP - General (Nurse Practitioner)     09/10/2022    9:04 AM 09/04/2021    8:26 AM 10/13/2019    3:10 PM  Advanced Directives  Does Patient Have a Medical Advance Directive? Yes Yes No  Type of Advance Directive Living will [place]   Does patient want to make changes to medical advance directive? No - Patient declined No - Patient declined   Copy of [place] in Chart?  No - copy requested   Would patient like information on creating a medical advance directive?   Yes (MAU/Ambulatory/Procedural Areas - Information given)    Hospital Utilization Over the Past 12 Months: # of hospitalizations or ER visits: 0 # of surgeries: 0  Review of Systems    Patient reports that her overall health is unchanged compared to last year.  History obtained from chart review and the  patient  Patient Reported Readings (BP, Pulse, CBG, Weight, etc) none  Pain Assessment Pain : 0-10 Pain Score: 2  Pain Type: Chronic pain Pain Location: Foot Pain Orientation: Right Pain Descriptors / Indicators: Aching, Constant Pain Onset: More than a month ago Pain Frequency: Intermittent     Current Medications & Allergies (verified) Allergies as of 09/10/2022       Reactions   Iodinated Contrast Media Other (See Comments)   Made one side of her "right side of body hurt real bad"   Lisinopril Swelling   Iodine    Soy Allergy         Medication List        Accurate as of September 10, 2022  9:22 AM. If you have any questions, ask your nurse or doctor.          AMBULATORY NON FORMULARY MEDICATION Please fit patient for a replacement breast prosthesis.  Fax to: [employer] [facility], [address], [place], [place], [place], phone number [phone], fax number [phone]   amLODipine 2.5 MG tablet Commonly known as: NORVASC Take 1 tablet (2.5 mg total) by mouth daily.   chlorthalidone 25 MG tablet Commonly known as: HYGROTON Take 1 tablet (25 mg total) by mouth daily.   meloxicam 15 MG tablet Commonly known as: MOBIC Take 1 tablet (15 mg total) by mouth daily.   RABEprazole 20 MG tablet Commonly known as: Aciphex Take 1 tablet (20 mg total) by mouth daily.   scopolamine 1 MG/3DAYS Commonly known as: TRANSDERM-SCOP Place 1 patch (1.5  mg total) onto the skin every 3 (three) days.        History (reviewed): Past Medical History:  Diagnosis Date   Allergy 07-09-2013   Seasonal   Blood transfusion without reported diagnosis 12-03-2001   ASCT   Breast cancer ([place])    Cancer ([place])    breast   GERD (gastroesophageal reflux disease)    Hypertension    Leucopenia    Vitamin D deficiency    Past Surgical History:  Procedure Laterality Date   BREAST SURGERY  05-24-2001   Modified rt mastectomy   LIMBAL  STEM CELL TRANSPLANT     MASTECTOMY Right    MODIFIED RADICAL MASTECTOMY Right    TUBAL LIGATION     WISDOM TOOTH EXTRACTION     Family History  Problem Relation Age of Onset   Heart disease Father    Alcohol abuse Father    Hyperlipidemia Father    Hypertension Father    Stroke Father    Prostate cancer Brother    Diabetes Brother    Hypertension Brother    Obesity Brother    Arthritis Mother    Varicose Veins Mother    Heart disease Sister    Cancer Paternal Aunt    Heart disease Sister    Social History   Socioeconomic History   Marital status: Married    Spouse name: Jori Moll   Number of children: 2   Years of education: 16   Highest education level: Bachelor's degree (e.g., BA, AB, BS)  Occupational History   Occupation: Retired  Tobacco Use   Smoking status: Never   Smokeless tobacco: Never  Vaping Use   Vaping Use: Never used  Substance and Sexual Activity   Alcohol use: No   Drug use: No   Sexual activity: Not Currently    Partners: Male    Birth control/protection: Post-menopausal  Other Topics Concern   Not on file  Social History Narrative   Lives with her youngest daughter. She enjoys baking and socializing with her friends.   Social Determinants of Health   Financial Resource Strain: Low Risk  (09/06/2022)   Overall Financial Resource Strain (CARDIA)    Difficulty of Paying Living Expenses: Not hard at all  Food Insecurity: No Food Insecurity (09/06/2022)   Hunger Vital Sign    Worried About Running Out of Food in the Last Year: Never true    Ran Out of Food in the Last Year: Never true  Transportation Needs: No Transportation Needs (09/06/2022)   PRAPARE - Hydrologist (Medical): No    Lack of Transportation (Non-Medical): No  Physical Activity: Sufficiently Active (09/06/2022)   Exercise Vital Sign    Days of Exercise per Week: 7 days    Minutes of Exercise per Session: 150+ min  Stress: No Stress Concern Present  (09/06/2022)   [place]    Feeling of Stress : Not at all  Social Connections: Unknown (09/10/2022)   Social Connection and Isolation Panel [NHANES]    Frequency of Communication with Friends and Family: More than three times a week    Frequency of Social Gatherings with Friends and Family: More than three times a week    Attends Religious Services: More than 4 times per year    Active Member of [employer] or Organizations: Yes    Attends Archivist Meetings: More than 4 times per year    Marital Status: Patient refused  Activities of Daily Living    09/06/2022    9:51 AM  In your present state of health, do you have any difficulty performing the following activities:  Hearing? 0  Vision? 0  Difficulty concentrating or making decisions? 0  Walking or climbing stairs? 0  Dressing or bathing? 0  Doing errands, shopping? 0  Preparing Food and eating ? N  Using the Toilet? N  In the past six months, have you accidently leaked urine? Y  Do you have problems with loss of bowel control? N  Managing your Medications? N  Managing your Finances? N  Housekeeping or managing your Housekeeping? N    Patient Education/ Literacy How often do you need to have someone help you when you read instructions, pamphlets, or other written materials from your doctor or pharmacy?: 1 - Never What is the last grade level you completed in school?: Bachelor's degree  Exercise Current Exercise Habits: Home exercise routine, Type of exercise: walking, Time (Minutes): > 60, Frequency (Times/Week): 7, Weekly Exercise (Minutes/Week): 0, Intensity: Moderate, Exercise limited by: None identified  Diet Patient reports consuming  3-4 smaller  meals a day and 2 snack(s) a day Patient reports that her primary diet is: Regular Patient reports that she does have regular access to food.   Depression Screen    09/10/2022    9:04 AM 08/16/2022     1:57 PM 09/04/2021    8:27 AM 06/22/2021    9:57 AM 03/30/2021    9:02 AM 10/13/2019    3:10 PM 03/10/2019    9:52 AM  PHQ 2/9 Scores  PHQ - 2 Score 0 0 0 0 0 0 0     Fall Risk    09/10/2022    9:04 AM 09/06/2022    9:51 AM 08/16/2022    1:57 PM 09/04/2021    8:27 AM 09/01/2021    4:49 PM  [place] in the past year? '1 1 1 1 1  '$ Number falls in past yr: '1 1 1 '$ 0 0  Injury with Fall? 1 1  0 0  Risk for fall due to : History of fall(s)  History of fall(s) No Fall Risks   Follow up Falls evaluation completed;Education provided;Falls prevention discussed  Falls evaluation completed Falls evaluation completed      Objective:  Sabrina Abbott seemed alert and oriented and she participated appropriately during our telephone visit.  Blood Pressure Weight BMI  BP Readings from Last 3 Encounters:  08/30/22 133/67  08/16/22 (!) 145/83  03/29/22 135/77   Wt Readings from Last 3 Encounters:  08/16/22 192 lb 12.8 oz (87.5 kg)  03/29/22 192 lb 11.2 oz (87.4 kg)  01/31/22 190 lb (86.2 kg)   BMI Readings from Last 1 Encounters:  08/30/22 29.32 kg/m    *Unable to obtain current vital signs, weight, and BMI due to telephone visit type  Hearing/Vision  Sabrina Abbott did not seem to have difficulty with hearing/understanding during the telephone conversation Reports that she has had a formal eye exam by an eye care professional within the past year Reports that she has not had a formal hearing evaluation within the past year *Unable to fully assess hearing and vision during telephone visit type  Cognitive Function:    09/10/2022    9:08 AM 09/04/2021    8:36 AM  6CIT Screen  What Year? 0 points 0 points  What month? 0 points 0 points  What time? 0 points 0 points  Count  back from 20 0 points 0 points  Months in reverse 0 points 0 points  Repeat phrase 0 points 0 points  Total Score 0 points 0 points   (Normal:0-7, Significant for Dysfunction: >8)  Normal Cognitive Function Screening:  Yes   Immunization & Health Maintenance Record Immunization History  Administered Date(s) Administered   Fluad Quad(high Dose 65+) 03/10/2019, 03/17/2020, 03/30/2021   Influenza, High Dose Seasonal PF 05/09/2015   Influenza,inj,Quad PF,6+ Mos 03/27/2018   Influenza-Unspecified 04/14/2012, 04/28/2013, 05/03/2014, 05/14/2016, 04/02/2017, 05/22/2022   PFIZER(Purple Top)SARS-COV-2 Vaccination 07/31/2019, 08/18/2019   Pfizer Covid-19 Vaccine Bivalent Booster 37yr & up 04/08/2020, 10/28/2020, 06/15/2022   Pneumococcal Polysaccharide-23 03/06/2018   Tdap 07/09/2005   Zoster, Live 12/23/2013    Health Maintenance  Topic Date Due   DTaP/Tdap/Td (2 - Td or Tdap) 07/10/2015   COVID-19 Vaccine (6 - 2023-24 season) 09/26/2022 (Originally 08/10/2022)   Pneumonia Vaccine 71 Years old (2 of 2 - PCV) 09/10/2023 (Originally 03/07/2019)   Fecal DNA (Cologuard)  03/25/2023   Medicare Annual Wellness (AWV)  09/10/2023   MAMMOGRAM  05/17/2024   INFLUENZA VACCINE  Completed   DEXA SCAN  Completed   Hepatitis C Screening  Completed   HPV VACCINES  Aged Out   Zoster Vaccines- Shingrix  Discontinued       Assessment  This is a routine wellness examination for Sabrina Abbott  Health Maintenance: Due or Overdue Health Maintenance Due  Topic Date Due   DTaP/Tdap/Td (2 - Td or Tdap) 07/10/2015    Sabrina Garterdoes not need a referral for Community Assistance: Care Management:   no Social Work:    no Prescription Assistance:  no Nutrition/Diabetes Education:  no   Plan:  Personalized Goals  Goals Addressed               This Visit's Progress     Patient Stated (pt-stated)        Patient stated that she would like to continue to maintain her weight.       Personalized Health Maintenance & Screening Recommendations  Pneumococcal vaccine  Td vaccine Cologuard due in September, 2024.  Lung Cancer Screening Recommended: no (Low Dose CT Chest recommended if Age 71-80years, 30  pack-year currently smoking OR have quit w/in past 15 years) Hepatitis C Screening recommended: no HIV Screening recommended: no  Advanced Directives: Written information was not prepared per patient's request.  Referrals & Orders No orders of the defined types were placed in this encounter.   Follow-up Plan Follow-up with JSamuel Bouche NP as planned Discuss pneumonia vaccine and bone density scan with PCP at the next appointment Schedule tetanus vaccine at the pharmacy.  Medicare wellness visit in one year. Patient will access AVS on my chart.   I have personally reviewed and noted the following in the patient's chart:   Medical and social history Use of alcohol, tobacco or illicit drugs  Current medications and supplements Functional ability and status Nutritional status Physical activity Advanced directives List of other physicians Hospitalizations, surgeries, and ER visits in previous 12 months Vitals Screenings to include cognitive, depression, and falls Referrals and appointments  In addition, I have reviewed and discussed with Sabrina Gartercertain preventive protocols, quality metrics, and best practice recommendations. A written personalized care plan for preventive services as well as general preventive health recommendations is available and can be mailed to the patient at her request.      Sabrina Gens RN BSN  09/10/2022

## 2022-09-13 ENCOUNTER — Ambulatory Visit (INDEPENDENT_AMBULATORY_CARE_PROVIDER_SITE_OTHER): Payer: Medicare HMO | Admitting: Medical-Surgical

## 2022-09-13 VITALS — BP 120/51 | HR 73

## 2022-09-13 DIAGNOSIS — I10 Essential (primary) hypertension: Secondary | ICD-10-CM

## 2022-09-13 MED ORDER — CHLORTHALIDONE 25 MG PO TABS: 25.0000 mg | ORAL_TABLET | Freq: Every day | ORAL | 0 refills | Status: DC

## 2022-09-13 MED ORDER — AMLODIPINE BESYLATE 2.5 MG PO TABS: 2.5000 mg | ORAL_TABLET | Freq: Every day | ORAL | 0 refills | Status: DC

## 2022-09-13 NOTE — Progress Notes (Signed)
Agree with documentation as below.  ___________________________________________ Gary Bultman L. Joshlyn Beadle, DNP, APRN, FNP-BC Primary Care and Sports Medicine [facility]  

## 2022-09-13 NOTE — Progress Notes (Signed)
Patient comes in today for blood pressure check.   Sabrina Abbott is taking Chlorthalidone 25 mg and Amlodipine 2.5 mg for blood pressure control. She denies any missed doses, side effects, headaches, palpitations, dizziness, or shortness of breath. She does endorse some chest pain, but this has been happening intermittently for awhile. She believes these are gas pains, but has a visit at the end of the month with Joy for evaluation. She was instructed if pain becomes constant or develops shortness of breath or other new symptoms to seek care in the emergency room.  [name] has been checking blood pressure readings at home. Her readings are as follows: 09/01/2022 134/82  09/03/2022 140/82 09/05/2022 131/82 09/04/2022 127/76 09/07/2022 123/70 09/10/2022 131/83 09/12/2022 132/80 09/13/2022 127/79  121/81  Her blood pressure reading today is: 120/51.   I spoke with Joy who advised to continue current medications and keep visit at the end of the month for evaluation. Refills sent to [facility].

## 2022-09-30 ENCOUNTER — Other Ambulatory Visit: Payer: Self-pay | Admitting: Medical-Surgical

## 2022-10-01 ENCOUNTER — Encounter: Payer: Self-pay | Admitting: Medical-Surgical

## 2022-10-01 ENCOUNTER — Ambulatory Visit (INDEPENDENT_AMBULATORY_CARE_PROVIDER_SITE_OTHER): Payer: Medicare HMO | Admitting: Medical-Surgical

## 2022-10-01 VITALS — BP 135/78 | HR 67 | Resp 20 | Ht 68.0 in | Wt 196.0 lb

## 2022-10-01 DIAGNOSIS — Z0000 Encounter for general adult medical examination without abnormal findings: Secondary | ICD-10-CM

## 2022-10-01 DIAGNOSIS — Z23 Encounter for immunization: Secondary | ICD-10-CM

## 2022-10-01 DIAGNOSIS — Z780 Asymptomatic menopausal state: Secondary | ICD-10-CM

## 2022-10-01 NOTE — Progress Notes (Signed)
Complete physical exam  Patient: Sabrina Abbott   DOB: 1951-03-25   72 y.o. Female  MRN: [id number]  Subjective:    Chief Complaint  Patient presents with   Annual Exam   Marilea Kaelin Fredlund is a 72 y.o. female who presents today for a complete physical exam. She reports consuming a  ovolactovegetarian  diet.  Walking at least 10k steps per day.  She generally feels well. She reports sleeping well. She does not have additional problems to discuss today.    Most recent fall risk assessment:    10/01/2022    8:49 AM  Fall Risk   Falls in the past year? 1  Number falls in past yr: 1  Injury with Fall? 1  Risk for fall due to : History of fall(s)  Follow up Falls evaluation completed     Most recent depression screenings:    10/01/2022    8:49 AM 09/10/2022    9:04 AM  PHQ 2/9 Scores  PHQ - 2 Score 0 0    Vision:Within last year and Dental: No current dental problems and Receives regular dental care    Patient Care Team: Samuel Bouche, NP as PCP - General (Nurse Practitioner)   Outpatient Medications Prior to Visit  Medication Sig   AMBULATORY NON [place] Please fit patient for a replacement breast prosthesis.  Fax to: [facility], [address], [address], [place], [place], [place], phone number [phone], fax number [phone]   amLODipine (NORVASC) 2.5 MG tablet Take 1 tablet (2.5 mg total) by mouth daily.   chlorthalidone (HYGROTON) 25 MG tablet Take 1 tablet (25 mg total) by mouth daily.   RABEprazole (ACIPHEX) 20 MG tablet Take 1 tablet (20 mg total) by mouth daily.   scopolamine (TRANSDERM-SCOP) 1 MG/3DAYS Place 1 patch (1.5 mg total) onto the skin every 3 (three) days.   [DISCONTINUED] meloxicam (MOBIC) 15 MG tablet Take 1 tablet (15 mg total) by mouth daily.   No facility-administered medications prior to visit.    Review of Systems  Constitutional:  Negative for chills, fever, malaise/fatigue and weight  loss.  HENT:  Negative for congestion, ear pain, hearing loss, sinus pain and sore throat.   Eyes:  Negative for blurred vision, photophobia and pain.  Respiratory:  Negative for cough, shortness of breath and wheezing.   Cardiovascular:  Negative for chest pain, palpitations and leg swelling.  Gastrointestinal:  Negative for abdominal pain, constipation, diarrhea, heartburn, nausea and vomiting.  Genitourinary:  Negative for dysuria, frequency and urgency.  Musculoskeletal:  Positive for joint pain. Negative for falls and neck pain.  Skin:  Negative for itching and rash.  Neurological:  Negative for dizziness, weakness and headaches.  Endo/Heme/Allergies:  Positive for environmental allergies. Negative for polydipsia. Does not bruise/bleed easily.  Psychiatric/Behavioral:  Negative for depression, substance abuse and suicidal ideas. The patient is not nervous/anxious and does not have insomnia.      Objective:     BP 133/79 (BP Location: Left Arm, Cuff Size: Normal)   Pulse 71   Resp 20   Ht 5\' 8"  (1.727 m)   Wt 196 lb (88.9 kg)   SpO2 100%   BMI 29.80 kg/m    Physical Exam Constitutional:      General: She is not in acute distress.    Appearance: Normal appearance. She is not ill-appearing.  HENT:     Head: Normocephalic and atraumatic.     Right Ear: Tympanic membrane, ear canal  and external ear normal. There is no impacted cerumen.     Left Ear: Tympanic membrane, ear canal and external ear normal. There is no impacted cerumen.     Nose: Nose normal. No congestion or rhinorrhea.     Mouth/Throat:     Mouth: Mucous membranes are moist.     Pharynx: No oropharyngeal exudate or posterior oropharyngeal erythema.  Eyes:     General: No scleral icterus.       Right eye: No discharge.        Left eye: No discharge.     Extraocular Movements: Extraocular movements intact.     Conjunctiva/sclera: Conjunctivae normal.     Pupils: Pupils are equal, round, and reactive to light.   Neck:     Thyroid: No thyromegaly.     Vascular: No carotid bruit or JVD.     Trachea: Trachea normal.  Cardiovascular:     Rate and Rhythm: Normal rate and regular rhythm.     Pulses: Normal pulses.     Heart sounds: Normal heart sounds. No murmur heard.    No friction rub. No gallop.  Pulmonary:     Effort: Pulmonary effort is normal. No respiratory distress.     Breath sounds: Normal breath sounds. No wheezing.  Abdominal:     General: Bowel sounds are normal. There is no distension.     Palpations: Abdomen is soft.     Tenderness: There is no abdominal tenderness. There is no guarding.  Musculoskeletal:        General: Normal range of motion.     Cervical back: Normal range of motion and neck supple.  Lymphadenopathy:     Cervical: No cervical adenopathy.  Skin:    General: Skin is warm and dry.  Neurological:     Mental Status: She is alert and oriented to person, place, and time.     Cranial Nerves: No cranial nerve deficit.  Psychiatric:        Mood and Affect: Mood normal.        Behavior: Behavior normal.        Thought Content: Thought content normal.        Judgment: Judgment normal.      No results found for any visits on 10/01/22.     Assessment & Plan:    Routine Health Maintenance and Physical Exam  Immunization History  Administered Date(s) Administered   Fluad Quad(high Dose 65+) 03/10/2019, 03/17/2020, 03/30/2021   Influenza, High Dose Seasonal PF 05/09/2015   Influenza,inj,Quad PF,6+ Mos 03/27/2018   Influenza-Unspecified 04/14/2012, 04/28/2013, 05/03/2014, 05/14/2016, 04/02/2017, 05/22/2022   PFIZER(Purple Top)SARS-COV-2 Vaccination 07/31/2019, 08/18/2019   Pfizer Covid-19 Vaccine Bivalent Booster 102yrs & up 04/08/2020, 10/28/2020, 06/15/2022   Pneumococcal Polysaccharide-23 03/06/2018   Tdap 07/09/2005   Zoster, Live 12/23/2013    Health Maintenance  Topic Date Due   DTaP/Tdap/Td (2 - Td or Tdap) 07/10/2015   COVID-19 Vaccine (6 -  2023-24 season) 10/17/2022 (Originally 08/10/2022)   Pneumonia Vaccine 17+ Years old (2 of 2 - PCV) 09/10/2023 (Originally 03/07/2019)   Fecal DNA (Cologuard)  03/25/2023   Medicare Annual Wellness (AWV)  09/10/2023   MAMMOGRAM  05/17/2024   INFLUENZA VACCINE  Completed   DEXA SCAN  Completed   Hepatitis C Screening  Completed   HPV VACCINES  Aged Out   Zoster Vaccines- Shingrix  Discontinued    Discussed health benefits of physical activity, and encouraged her to engage in regular exercise appropriate for her age and condition.  1. Annual physical exam Deferring labs today since she has recent ones completed.  Up-to-date on preventative care.  Wellness information provided with AVS.  2. Postmenopausal DEXA scan ordered today. - DG Bone Density; Future  3. Need for pneumococcal vaccination Prevnar 20 given in office today.  Return in about 6 months (around 04/03/2023) for chronic disease follow up.   Samuel Bouche, NP

## 2022-10-01 NOTE — Addendum Note (Signed)
Addended by: Beverlee Nims on: 10/01/2022 11:49 AM   Modules accepted: Orders

## 2022-10-09 DIAGNOSIS — H524 Presbyopia: Secondary | ICD-10-CM | POA: Diagnosis not present

## 2022-10-11 ENCOUNTER — Encounter: Payer: Self-pay | Admitting: Podiatry

## 2022-10-11 ENCOUNTER — Ambulatory Visit: Payer: Medicare HMO | Admitting: Podiatry

## 2022-10-11 DIAGNOSIS — M76821 Posterior tibial tendinitis, right leg: Secondary | ICD-10-CM

## 2022-10-11 NOTE — Progress Notes (Signed)
  Subjective:  Patient ID: Sabrina Abbott, female    DOB: 1950/11/30,   MRN: [id number]  Chief Complaint  Patient presents with   PTTD    Patient states right foot is getting better but she still has pain    72 y.o. female presents for follow-up of right PTTD. Relates getting better but still having pain. She has been stretching and using brace and taking meloxicam. Relates more than 50% better. Relates the meloxicam gave her bad stomach upset so had to stop taking.   Denies any other pedal complaints. Denies n/v/f/c.   Past Medical History:  Diagnosis Date   Allergy 07-09-2013   Seasonal   Blood transfusion without reported diagnosis 12-03-2001   ASCT   Breast cancer ([facility])    Cancer ([facility])    breast   GERD (gastroesophageal reflux disease)    Hypertension    Leucopenia    Vitamin D deficiency     Objective:  Physical Exam: Vascular: DP/PT pulses 2/4 bilateral. CFT <3 seconds. Normal hair growth on digits. No edema.  Skin. No lacerations or abrasions bilateral feet.  Musculoskeletal: MMT 5/5 bilateral lower extremities in DF, PF, Inversion and Eversion. Deceased ROM in DF of ankle joint.  Non tender to insertion of PT tendon and proximally along course to medial malleolus. No pain with PF and inversion of the foot. Unable to perform single limb heel rise. Pes planus noted with too many toes sign.  Neurological: Sensation intact to light touch.   Assessment:   1. Posterior tibial tendon dysfunction (PTTD) of right lower extremity      Plan:  Patient was evaluated and treated and all questions answered. X-rays reviewed and discussed with patient. Pes planus noted to right foot with collapse of medial arch. No acute fractures or dislocations noted.  Discussed PTTD diagnosis and treatment options with patient. Continue stretching and brace as needed.  Discussed orthotics and will check on insurance coverage vs doing OTC option.  Discussed if there is no improvement  PT/MRI/injection may be an option. Patient to return to clinic as needed.    Lorenda Peck, DPM

## 2022-10-16 ENCOUNTER — Telehealth: Payer: Self-pay | Admitting: Podiatry

## 2022-10-16 NOTE — Telephone Encounter (Signed)
[employer] sent back prior auth not needed , for predetermination notice.  - I will upload in chart .      Spoke with patient she said she is going to call them and see if they will tell her what her out of pocket expense will be and call us back if she wants to proceed.

## 2022-10-17 ENCOUNTER — Ambulatory Visit (INDEPENDENT_AMBULATORY_CARE_PROVIDER_SITE_OTHER): Payer: Medicare HMO

## 2022-10-17 DIAGNOSIS — Z780 Asymptomatic menopausal state: Secondary | ICD-10-CM | POA: Diagnosis not present

## 2022-10-18 ENCOUNTER — Telehealth: Payer: Self-pay | Admitting: Podiatry

## 2022-10-18 NOTE — Telephone Encounter (Signed)
Patient called she said her insurance will cover 80 percent of the orthotics , she is ok with that , go ahead and order orthotics.

## 2022-10-23 ENCOUNTER — Ambulatory Visit
Admission: EM | Admit: 2022-10-23 | Discharge: 2022-10-23 | Disposition: A | Discharge status: Home / Self Care | Payer: Medicare HMO | Attending: Internal Medicine | Admitting: Internal Medicine

## 2022-10-23 ENCOUNTER — Encounter: Payer: Self-pay | Admitting: Emergency Medicine

## 2022-10-23 DIAGNOSIS — R3129 Other microscopic hematuria: Secondary | ICD-10-CM | POA: Diagnosis not present

## 2022-10-23 DIAGNOSIS — R300 Dysuria: Secondary | ICD-10-CM | POA: Insufficient documentation

## 2022-10-23 LAB — POCT URINALYSIS DIP (MANUAL ENTRY)
Bilirubin, UA: NEGATIVE
Glucose, UA: NEGATIVE mg/dL
Nitrite, UA: NEGATIVE
Protein Ur, POC: NEGATIVE mg/dL
Spec Grav, UA: 1.015 (ref 1.010–1.025)
Urobilinogen, UA: 0.2 E.U./dL
pH, UA: 6 (ref 5.0–8.0)

## 2022-10-23 MED ORDER — CEFDINIR 300 MG PO CAPS: 300.0000 mg | ORAL_CAPSULE | Freq: Two times a day (BID) | ORAL | 0 refills | Status: AC

## 2022-10-23 NOTE — ED Provider Notes (Signed)
[facility]  Note:  This document was prepared using Dragon voice recognition software and may include unintentional dictation errors.  MRN: [id number] DOB: [birth year] DATE: 10/23/22   Subjective:  Chief Complaint:  Chief Complaint  Patient presents with   Dysuria    HPI: Sabrina Abbott is a 72 y.o. female presenting for dysuria and urinary frequency for 2 days.  Patient reports pain with urination that started yesterday.  She also reports increased frequency and increased urge.  She has history of UTIs that has increased around menopause.  Denies fever, nausea/vomiting, back pain. Endorses dysuria, increased frequency, increased urge. Presents NAD.  Prior to Admission medications   Medication Sig Start Date End Date Taking? Authorizing Provider  AMBULATORY NON [facility] MED[facility] Please fit patient for a replacement breast prosthesis.  Fax to: [facility], [address], [place], [place], [address], phone number [phone], fax number [phone] 11/09/21   Christen Butter, NP  amLODipine (NORVASC) 2.5 MG tablet Take 1 tablet (2.5 mg total) by mouth daily. 09/13/22   Christen Butter, NP  chlorthalidone (HYGROTON) 25 MG tablet Take 1 tablet (25 mg total) by mouth daily. 09/13/22   Christen Butter, NP  RABEprazole (ACIPHEX) 20 MG tablet Take 1 tablet (20 mg total) by mouth daily. 08/16/22   Christen Butter, NP  scopolamine (TRANSDERM-SCOP) 1 MG/3DAYS Place 1 patch (1.5 mg total) onto the skin every 3 (three) days. 06/11/22   Christen Butter, NP     Allergies  Allergen Reactions   Iodinated Contrast Media Other (See Comments)    Made one side of her "right side of body hurt real bad"    Lisinopril Swelling   Iodine    Meloxicam Other (See Comments)    GI upset    Soy Allergy     History:   Past Medical History:  Diagnosis Date   Allergy 07-09-2013   Seasonal   Blood transfusion without reported diagnosis 12-03-2001   ASCT   Breast cancer     Cancer    breast   GERD (gastroesophageal reflux disease)    Hypertension    Leucopenia    Vitamin D deficiency      Past Surgical History:  Procedure Laterality Date   BREAST SURGERY  05-24-2001   Modified rt mastectomy   LIMBAL STEM CELL TRANSPLANT     MASTECTOMY Right    MODIFIED RADICAL MASTECTOMY Right    TUBAL LIGATION     WISDOM TOOTH EXTRACTION      Family History  Problem Relation Age of Onset   Heart disease Father    Alcohol abuse Father    Hyperlipidemia Father    Hypertension Father    Stroke Father    Prostate cancer Brother    Diabetes Brother    Hypertension Brother    Obesity Brother    Arthritis Mother    Varicose Veins Mother    Heart disease Sister    Cancer Paternal Aunt    Heart disease Sister     Social History   Tobacco Use   Smoking status: Never   Smokeless tobacco: Never  Vaping Use   Vaping Use: Never used  Substance Use Topics   Alcohol use: No   Drug use: No    Review of Systems  Constitutional:  Negative for fever.  Gastrointestinal:  Positive for abdominal pain. Negative for nausea and vomiting.  Genitourinary:  Positive for dysuria, frequency and urgency. Negative for flank pain.  Musculoskeletal:  Negative for  back pain.     Objective:   Vitals: BP (!) 144/83 (BP Location: Left Arm)   Pulse 70   Temp (!) 97.5 F (36.4 C) (Oral)   Resp 16   SpO2 98%   Physical Exam Constitutional:      General: She is not in acute distress.    Appearance: Normal appearance. She is well-developed. She is obese. She is not ill-appearing or toxic-appearing.  HENT:     Head: Normocephalic and atraumatic.  Cardiovascular:     Rate and Rhythm: Normal rate and regular rhythm.     Heart sounds: Normal heart sounds.  Pulmonary:     Effort: Pulmonary effort is normal.     Breath sounds: Normal breath sounds.     Comments: Clear to auscultation bilaterally  Abdominal:     General: Bowel sounds are normal.     Palpations: Abdomen  is soft.     Tenderness: There is no abdominal tenderness. There is no right CVA tenderness or left CVA tenderness.  Skin:    General: Skin is warm and dry.  Neurological:     General: No focal deficit present.     Mental Status: She is alert.  Psychiatric:        Mood and Affect: Mood and affect normal.     Results:  Labs: Results for orders placed or performed during the hospital encounter of 10/23/22 (from the past 24 hour(s))  POCT urinalysis dipstick     Status: Abnormal   Collection Time: 10/23/22  9:02 AM  Result Value Ref Range   Color, UA yellow yellow   Clarity, UA hazy (A) clear   Glucose, UA negative negative mg/dL   Bilirubin, UA negative negative   Ketones, POC UA trace (5) (A) negative mg/dL   Spec Grav, UA 3.532 9.924 - 1.025   Blood, UA small (A) negative   pH, UA 6.0 5.0 - 8.0   Protein Ur, POC negative negative mg/dL   Urobilinogen, UA 0.2 0.2 or 1.0 E.U./dL   Nitrite, UA Negative Negative   Leukocytes, UA Moderate (2+) (A) Negative    Radiology: No results found.   UC Course/Treatments:  Procedures: Procedures   Medications Ordered in UC: Medications - No data to display   Assessment and Plan :     ICD-10-CM   1. Dysuria  R30.0 Urine Culture    Urine Culture    2. Other microscopic hematuria  R31.29      Dysuria: Afebrile, nontoxic-appearing, NAD. VSS. DDX includes but not limited to: Cystitis, pyelonephritis, yeast infection, bacterial vaginitis, interstitial cystitis UA was positive for leukocytes and blood today in office.  Urine culture is pending.  Suspect cystitis giving UA results and dysuria.  Cefdinir 300 mg twice daily was prescribed to treat urinary tract infection. Strict ED precautions were given and patient verbalized understanding.  Microscopic hematuria: Afebrile, nontoxic-appearing, NAD. VSS. DDX includes but not limited to: Cystitis, pyelonephritis, interstitial cystitis UA was positive for leukocytes and blood today in  office.  Urine culture is pending.  Suspect cystitis giving UA results and dysuria.  Cefdinir 300 mg twice daily was prescribed to treat urinary tract infection. Strict ED precautions were given and patient verbalized understanding.   ED Discharge Orders          Ordered    cefdinir (OMNICEF) 300 MG capsule  2 times daily        10/23/22 0908             PDMP  not reviewed this encounter.     Cynda Acres, PA-C 10/23/22 1610

## 2022-10-23 NOTE — Discharge Instructions (Signed)
Your urine sample was consistent with a urinary tract infection. You were prescribed Cefdinir which is an antibiotic that is often used to treat urinary tract infections.  Take the prescription as directed.   A urine culture has been sent to the lab for further testing.  We will call you with those results.  If the prescription needs to be changed, it will be done so at that time.   Return in 2 to 3 days if no improvement. Please go directly to the Emergency Department immediately should you begin to have any of the following symptoms: persistent fevers, increased pain or persistent nausea/vomiting. 

## 2022-10-23 NOTE — ED Triage Notes (Signed)
Patient presents to [facility] for evaluation of urgency and lower abdominal pain during urination since yesterday.  Patient states she has norovirus in February wiTH A LOT OF diarrhea, and she was "expecting this"/

## 2022-10-24 LAB — URINE CULTURE: Culture: NO GROWTH

## 2022-10-25 NOTE — Telephone Encounter (Signed)
Sent appeal request for office notes and order. Faxed today

## 2022-10-31 NOTE — Telephone Encounter (Signed)
Received approval for orthotics  reference number [id number] - sending documenation to scan center

## 2022-11-15 ENCOUNTER — Encounter: Payer: Self-pay | Admitting: Medical-Surgical

## 2022-11-28 ENCOUNTER — Other Ambulatory Visit: Payer: Self-pay | Admitting: Podiatry

## 2022-11-28 ENCOUNTER — Encounter: Payer: Self-pay | Admitting: Podiatry

## 2022-11-28 MED ORDER — DICLOFENAC SODIUM 75 MG PO TBEC: 75.0000 mg | DELAYED_RELEASE_TABLET | Freq: Two times a day (BID) | ORAL | 0 refills | Status: DC

## 2022-12-07 ENCOUNTER — Ambulatory Visit: Payer: Medicare HMO | Admitting: Podiatry

## 2022-12-07 ENCOUNTER — Encounter: Payer: Self-pay | Admitting: Podiatry

## 2022-12-07 DIAGNOSIS — M76822 Posterior tibial tendinitis, left leg: Secondary | ICD-10-CM | POA: Diagnosis not present

## 2022-12-07 DIAGNOSIS — M76821 Posterior tibial tendinitis, right leg: Secondary | ICD-10-CM | POA: Diagnosis not present

## 2022-12-07 MED ORDER — DEXAMETHASONE SODIUM PHOSPHATE 120 MG/30ML IJ SOLN
4.0000 mg | Freq: Once | INTRAMUSCULAR | Status: AC
Start: 2022-12-07 — End: 2022-12-07
  Administered 2022-12-07: 4 mg via INTRA_ARTICULAR

## 2022-12-07 NOTE — Progress Notes (Signed)
  Subjective:  Patient ID: EQUILLA PYLAND, female    DOB: 03-30-1951,   MRN: [id number]  Chief Complaint  Patient presents with   Foot Pain    Right foot pain     72 y.o. female presents for follow-up of right PTTD. Relates pain has been getting worse recently. Relates the brace has been helping but getting pain when getting up and walking. .    Denies any other pedal complaints. Denies n/v/f/c.   Past Medical History:  Diagnosis Date   Allergy 07-09-2013   Seasonal   Blood transfusion without reported diagnosis 12-03-2001   ASCT   Breast cancer ([facility])    Cancer ([facility])    breast   GERD (gastroesophageal reflux disease)    Hypertension    Leucopenia    Vitamin D deficiency     Objective:  Physical Exam: Vascular: DP/PT pulses 2/4 bilateral. CFT <3 seconds. Normal hair growth on digits. No edema.  Skin. No lacerations or abrasions bilateral feet.  Musculoskeletal: MMT 5/5 bilateral lower extremities in DF, PF, Inversion and Eversion. Deceased ROM in DF of ankle joint.  Non tender to insertion of PT tendon and proximally along course to medial malleolus. No pain with PF and inversion of the foot. Unable to perform single limb heel rise. Pes planus noted with too many toes sign.  Neurological: Sensation intact to light touch.   Assessment:   1. Posterior tibial tendon dysfunction (PTTD) of right lower extremity       Plan:  Patient was evaluated and treated and all questions answered. X-rays reviewed and discussed with patient. Pes planus noted to right foot with collapse of medial arch. No acute fractures or dislocations noted.  Discussed PTTD diagnosis and treatment options with patient. Continue stretching and brace as needed.  Discussed trying injection today. Procedure below.  Discussed CMO and will get scheduled for fitting.  Discussed if there is no improvement PT/MRI may be an option. Patient to return to clinic in 6 weeks for recheck    Procedure: Injection  Tendon/Ligament Discussed alternatives, risks, complications and verbal consent was obtained.  Location: Right posterior tibial tendon. Skin Prep: Alcohol. Injectate: 1cc 0.5% marcaine plain, 1 cc dexamethasone.  Disposition: Patient tolerated procedure well. Injection site dressed with a band-aid.  Post-injection care was discussed and return precautions discussed.    Louann Sjogren, DPM

## 2022-12-14 ENCOUNTER — Ambulatory Visit: Payer: Medicare HMO | Admitting: Podiatry

## 2022-12-19 ENCOUNTER — Other Ambulatory Visit: Payer: Self-pay | Admitting: Medical-Surgical

## 2022-12-31 ENCOUNTER — Ambulatory Visit (INDEPENDENT_AMBULATORY_CARE_PROVIDER_SITE_OTHER): Payer: Medicare HMO | Admitting: Podiatry

## 2022-12-31 DIAGNOSIS — M76821 Posterior tibial tendinitis, right leg: Secondary | ICD-10-CM

## 2022-12-31 NOTE — Progress Notes (Signed)
Patient presents today to be measured for custom orthotics .  Patient was scanned with the [employer] scanner for 1 pair of custom orthotics.  Financial paperwork was signed.

## 2023-01-18 ENCOUNTER — Ambulatory Visit: Payer: Medicare HMO | Admitting: Podiatry

## 2023-01-18 ENCOUNTER — Encounter: Payer: Self-pay | Admitting: Podiatry

## 2023-01-18 DIAGNOSIS — M76821 Posterior tibial tendinitis, right leg: Secondary | ICD-10-CM

## 2023-01-18 MED ORDER — MELOXICAM 15 MG PO TABS
15.0000 mg | ORAL_TABLET | Freq: Every day | ORAL | 0 refills | Status: DC
Start: 2023-01-18 — End: 2023-02-15

## 2023-01-18 NOTE — Progress Notes (Signed)
  Subjective:  Patient ID: Sabrina Abbott, female    DOB: 1951-01-10,   MRN: [id number]  Chief Complaint  Patient presents with   Foot Pain    Right foot pain , patient states foot is still improving     72 y.o. female presents for follow-up of right PTTD. Relates pain is improving and injeciton helped a little but did make it worse temporarily. Relates about 50% better but frustrated that she can't go long distances with walking like she used to. Relates the diclofenac made her throw up but did go back to the meloxicam and was able to tolerate taking it every few days. Has continued the brace and has not received orthotics yet.      Denies any other pedal complaints. Denies n/v/f/c.   Past Medical History:  Diagnosis Date   Allergy 07-09-2013   Seasonal   Blood transfusion without reported diagnosis 12-03-2001   ASCT   Breast cancer ([facility])    Cancer ([facility])    breast   GERD (gastroesophageal reflux disease)    Hypertension    Leucopenia    Vitamin D deficiency     Objective:  Physical Exam: Vascular: DP/PT pulses 2/4 bilateral. CFT <3 seconds. Normal hair growth on digits. No edema.  Skin. No lacerations or abrasions bilateral feet.  Musculoskeletal: MMT 5/5 bilateral lower extremities in DF, PF, Inversion and Eversion. Deceased ROM in DF of ankle joint.  Non tender to insertion of PT tendon and proximally along course to medial malleolus. No pain with PF and inversion of the foot. Unable to perform single limb heel rise. Pes planus noted with too many toes sign.  Neurological: Sensation intact to light touch.   Assessment:   1. Posterior tibial tendon dysfunction (PTTD) of right lower extremity        Plan:  Patient was evaluated and treated and all questions answered. X-rays reviewed and discussed with patient. Pes planus noted to right foot with collapse of medial arch. No acute fractures or dislocations noted.  Discussed PTTD diagnosis and treatment options with  patient. Continue stretching and brace as needed.  Will defer another injection Amb ref to PT  Awaiting CMO Refilled meloxcam as she does ok if she takes as needed.  Discussed if there is no improvement PT/MRI may be an option. Patient to return to clinic in 8 weeks for recheck      Louann Sjogren, DPM

## 2023-01-24 ENCOUNTER — Ambulatory Visit: Payer: Medicare HMO | Admitting: Podiatry

## 2023-01-24 DIAGNOSIS — M76821 Posterior tibial tendinitis, right leg: Secondary | ICD-10-CM

## 2023-01-24 NOTE — Progress Notes (Signed)
Patient presents today for orthotic pick up. Patient voices no new complaints.   Orthotics were fitted to patient's feet. No discomfort and no rubbing. Patient satisfied with the orthotics.   Orthotics were dispensed to patient with instructions for break in wear and to call the office if any concerns or questions.  

## 2023-01-29 NOTE — Therapy (Signed)
OUTPATIENT PHYSICAL THERAPY LOWER EXTREMITY EVALUATION   Patient Name: Sabrina Abbott MRN: [id number] DOB:1951-03-19, 72 y.o., female Today's Date: 01/30/2023  END OF SESSION:  PT End of Session - 01/30/23 1007     Visit Number 1    Date for PT Re-Evaluation 03/13/23    Authorization Type [employer] MCR    Progress Note Due on Visit 10    PT Start Time 1015    PT Stop Time 1056    PT Time Calculation (min) 41 min    Activity Tolerance Patient tolerated treatment well    Behavior During Therapy [facility] for tasks assessed/performed             Past Medical History:  Diagnosis Date   Allergy 07-09-2013   Seasonal   Blood transfusion without reported diagnosis 12-03-2001   ASCT   Breast cancer (HCC)    Cancer (HCC)    breast   GERD (gastroesophageal reflux disease)    Hypertension    Leucopenia    Vitamin D deficiency    Past Surgical History:  Procedure Laterality Date   BREAST SURGERY  05-24-2001   Modified rt mastectomy   LIMBAL STEM CELL TRANSPLANT     MASTECTOMY Right    MODIFIED RADICAL MASTECTOMY Right    TUBAL LIGATION     WISDOM TOOTH EXTRACTION     Patient Active Problem List   Diagnosis Date Noted   Posterior tibial tendonitis of right leg 08/30/2022   Nutritional counseling 10/27/2019   Colon cancer screening 03/25/2017   Elevated liver enzymes 03/05/2017   Leucopenia 03/05/2017   Malignant neoplasm of right breast (HCC) 12/22/2014   Cough 09/03/2014   Angioedema 07/16/2014   Restrictive ventilatory defect 07/16/2014   Dermatofibroma of deltoid region 09/25/2012   Hypertension 05/05/2012   GERD (gastroesophageal reflux disease) 05/05/2012   History of breast cancer 05/05/2012   Nail abnormality 05/05/2012    PCP: Christen Butter, NP   REFERRING PROVIDER: Louann Sjogren, DPM   REFERRING DIAG: [phone] (ICD-10-CM) - Posterior tibial tendon dysfunction (PTTD) of right lower extremity   THERAPY DIAG:  Pain in right ankle and joints of right  foot  Stiffness of right ankle, not elsewhere classified  Muscle weakness (generalized)  Rationale for Evaluation and Treatment: Rehabilitation  ONSET DATE: Sept 2023  SUBJECTIVE:   SUBJECTIVE STATEMENT: Thought it was plantar faciitis. She was icing and got inserts but nothing helped. She has new orthotics. Pain is mainly anterior to medial malleolus. Ankle feels stable with her orthotics. She had zero pain for three days with Meloxicam but then bothered her stomach so she takes once per week. Used to walk 1 hour/3 miles and now she is limited to 30 min. Has to walk on her heel descending stairs in the morning with step to gait pattern. Going up normal. Really feels if walking on tip toes.  PERTINENT HISTORY: Breast CA 2002 PAIN:  Are you having pain? Yes: NPRS scale: 1 up to 7/10 Pain location: R ant of medial malleolus Pain description: throbbing and sharp Aggravating factors: walking Relieving factors: NWB and ice  PRECAUTIONS: None  RED FLAGS: None   WEIGHT BEARING RESTRICTIONS: No  FALLS:  Has patient fallen in last 6 months? Yes. Number of falls 1 walking to the mailbox and stepped on uneven sidewalk  LIVING ENVIRONMENT: Lives with: lives with their family Lives in: House/apartment Stairs: Yes: Internal: 15 steps; on right going up and External: 0 steps; n/a Has following equipment at home: None  OCCUPATION: retired  PLOF: Independent  PATIENT GOALS: wants to learn specific exercises  NEXT MD VISIT: September  OBJECTIVE:   DIAGNOSTIC FINDINGS:  XR 09/01/22 IMPRESSION: 1. Hallux valgus and 1st MTP joint degenerative changes. 2. Pes planus. 3. Moderate-sized inferior calcaneal enthesophyte.  PATIENT SURVEYS:  LEFS 71 / 80 = 88.8 %  COGNITION: Overall cognitive status: Within functional limits for tasks assessed     SENSATION: WFL  EDEMA:  Occasional swelling at the end of the day  MUSCLE LENGTH: Tight heel cords B  POSTURE:  bil pes planus  R>L, R hallux valgus  PALPATION: TTP just superior to navicular insertion  LOWER EXTREMITY ROM:  A/P ROM Right eval Left eval  Ankle dorsiflexion 6/10 -4/5  Ankle plantarflexion 56 60  Ankle inversion 24/42 41  Ankle eversion 6/16 19   (Blank rows = not tested)  LOWER EXTREMITY MMT:  MMT Right eval Left eval  Hip flexion 5   Hip extension    Hip abduction    Hip adduction    Hip internal rotation    Hip external rotation    Knee flexion 5   Knee extension 5   Ankle dorsiflexion 5   Ankle plantarflexion 2+   Ankle inversion 5   Ankle eversion 5    (Blank rows = not tested)  LOWER EXTREMITY SPECIAL TESTS:    FUNCTIONAL TESTS:  SLS R 1-2 seconds, L 15 sec plus  GAIT: Distance walked: 20 Assistive device utilized: None Level of assistance: Complete Independence Comments: R hip ER and some pain in ant medial ankle   TODAY'S TREATMENT:                                                                                                                              DATE:   01/30/23 See pt ed and HEP   PATIENT EDUCATION:  Education details: PT eval findings, anticipated POC, and initial HEP  Person educated: Patient Education method: Explanation, Demonstration, and Handouts Education comprehension: verbalized understanding and returned demonstration  HOME EXERCISE PROGRAM: Access Code: [facility] URL: [email or web address] Date: 01/30/2023 Prepared by: Raynelle Fanning  Exercises - Gastroc Stretch on Wall  - 2 x daily - 7 x weekly - 1 sets - 3 reps - 30 hold - Standing Heel Raises  - 1 x daily - 7 x weekly - 3 sets - 10 reps - 3 sec hold - Seated Calf Raise With Small Ball at Heels  - 1 x daily - 7 x weekly - 1 sets - 10 reps - Supine Ankle Circles  - 1 x daily - 7 x weekly - 1 sets - 10 reps  ASSESSMENT:  CLINICAL IMPRESSION: Patient is a 72 y.o. female who was seen today for physical therapy evaluation and treatment for R posterior tibialis tendon  dysfunction. She has pain with walking and descending stairs. She has limitations in R ankle ROM and strength and single leg balance deficits on the R. She  will benefit from skilled PT to address these deficits.  .   OBJECTIVE IMPAIRMENTS: decreased activity tolerance, decreased balance, decreased ROM, decreased strength, increased edema, impaired flexibility, postural dysfunction, and pain.   ACTIVITY LIMITATIONS: stairs and locomotion level  PARTICIPATION LIMITATIONS: community activity  PERSONAL FACTORS: Time since onset of injury/illness/exacerbation are also affecting patient's functional outcome.   REHAB POTENTIAL: Good  CLINICAL DECISION MAKING: Stable/uncomplicated  EVALUATION COMPLEXITY: Low   GOALS: Goals reviewed with patient? Yes  SHORT TERM GOALS: Target date: 02/13/2023 Patient will be independent with initial HEP. Baseline:  Goal status: INITIAL  LONG TERM GOALS: Target date: 03/13/2023  Patient will be independent with advanced/ongoing HEP to improve outcomes and carryover.  Baseline:  Goal status: INITIAL  2.  Patient will report at least 85% improvement in R ankle pain to improve QOL. Baseline:  Goal status: INITIAL  3.  Patient will demonstrate improved R ankle active INV/EVER by 10 deg  to allow for normal mechanics. Baseline: see objective chart Goal status: INITIAL  4.  Patient will demonstrate improved functional LE strength as demonstrated by ability to perform single heel raise on R LE. Baseline:  Goal status: INITIAL  5.  Patient will be able to ambulate for >= 45 min without having to stop from pain.  Baseline:  Goal status: INITIAL  6. Patient will be able to descend stairs with 1 HR and reciprocal step pattern safely to access home and community.  Baseline:  Goal status: INITIAL  7.  Patient will report 63 on LEFS  to demonstrate improved functional ability. Baseline: 71 / 80 = 88.8 % Goal status: INITIAL  8.  Patient able to balance for  >= 5 sec or more on R LE demonstrating improved strength . Baseline:  Goal status: INITIAL     PLAN:  PT FREQUENCY: 1x/week  PT DURATION: 6 weeks  PLANNED INTERVENTIONS: Therapeutic exercises, Therapeutic activity, Neuromuscular re-education, Balance training, Gait training, Patient/Family education, Self Care, Joint mobilization, Stair training, Dry Needling, Electrical stimulation, Cryotherapy, Moist heat, Taping, Vasopneumatic device, Ultrasound, Ionotophoresis 4mg /ml Dexamethasone, and Manual therapy  PLAN FOR NEXT SESSION: Eccentric heel raises with ball and 3 sec TEMPO, Balance, descending stairs, Inv/Ever ROM, ionto?   Solon Palm, PT  01/30/2023, 3:30 PM

## 2023-01-30 ENCOUNTER — Other Ambulatory Visit: Payer: Self-pay

## 2023-01-30 ENCOUNTER — Ambulatory Visit: Payer: Medicare HMO | Attending: Podiatry | Admitting: Physical Therapy

## 2023-01-30 DIAGNOSIS — M25671 Stiffness of right ankle, not elsewhere classified: Secondary | ICD-10-CM | POA: Diagnosis not present

## 2023-01-30 DIAGNOSIS — M76821 Posterior tibial tendinitis, right leg: Secondary | ICD-10-CM | POA: Diagnosis not present

## 2023-01-30 DIAGNOSIS — M25571 Pain in right ankle and joints of right foot: Secondary | ICD-10-CM | POA: Diagnosis not present

## 2023-01-30 DIAGNOSIS — M6281 Muscle weakness (generalized): Secondary | ICD-10-CM | POA: Diagnosis not present

## 2023-02-05 NOTE — Therapy (Incomplete)
OUTPATIENT PHYSICAL THERAPY LOWER EXTREMITY TREATMENT   Patient Name: Sabrina Abbott MRN: [id number] DOB:10-06-1950, 72 y.o., female Today's Date: 02/05/2023  END OF SESSION:    Past Medical History:  Diagnosis Date   Allergy 07-09-2013   Seasonal   Blood transfusion without reported diagnosis 12-03-2001   ASCT   Breast cancer (HCC)    Cancer (HCC)    breast   GERD (gastroesophageal reflux disease)    Hypertension    Leucopenia    Vitamin D deficiency    Past Surgical History:  Procedure Laterality Date   BREAST SURGERY  05-24-2001   Modified rt mastectomy   LIMBAL STEM CELL TRANSPLANT     MASTECTOMY Right    MODIFIED RADICAL MASTECTOMY Right    TUBAL LIGATION     WISDOM TOOTH EXTRACTION     Patient Active Problem List   Diagnosis Date Noted   Posterior tibial tendonitis of right leg 08/30/2022   Nutritional counseling 10/27/2019   Colon cancer screening 03/25/2017   Elevated liver enzymes 03/05/2017   Leucopenia 03/05/2017   Malignant neoplasm of right breast (HCC) 12/22/2014   Cough 09/03/2014   Angioedema 07/16/2014   Restrictive ventilatory defect 07/16/2014   Dermatofibroma of deltoid region 09/25/2012   Hypertension 05/05/2012   GERD (gastroesophageal reflux disease) 05/05/2012   History of breast cancer 05/05/2012   Nail abnormality 05/05/2012    PCP: Christen Butter, NP   REFERRING PROVIDER: Louann Sjogren, DPM   REFERRING DIAG: [phone] (ICD-10-CM) - Posterior tibial tendon dysfunction (PTTD) of right lower extremity   THERAPY DIAG:  No diagnosis found.  Rationale for Evaluation and Treatment: Rehabilitation  ONSET DATE: Sept 2023  SUBJECTIVE:   SUBJECTIVE STATEMENT: ***  PERTINENT HISTORY: Breast CA 2002 PAIN:  Are you having pain? Yes: NPRS scale: 1 up to 7/10 Pain location: R ant of medial malleolus Pain description: throbbing and sharp Aggravating factors: walking Relieving factors: NWB and ice  PRECAUTIONS: None  RED  FLAGS: None   WEIGHT BEARING RESTRICTIONS: No  FALLS:  Has patient fallen in last 6 months? Yes. Number of falls 1 walking to the mailbox and stepped on uneven sidewalk  LIVING ENVIRONMENT: Lives with: lives with their family Lives in: House/apartment Stairs: Yes: Internal: 15 steps; on right going up and External: 0 steps; n/a Has following equipment at home: None  OCCUPATION: retired  PLOF: Independent  PATIENT GOALS: wants to learn specific exercises  NEXT MD VISIT: September  OBJECTIVE:   DIAGNOSTIC FINDINGS:  XR 09/01/22 IMPRESSION: 1. Hallux valgus and 1st MTP joint degenerative changes. 2. Pes planus. 3. Moderate-sized inferior calcaneal enthesophyte.  PATIENT SURVEYS:  LEFS 71 / 80 = 88.8 %  COGNITION: Overall cognitive status: Within functional limits for tasks assessed     SENSATION: WFL  EDEMA:  Occasional swelling at the end of the day  MUSCLE LENGTH: Tight heel cords B  POSTURE:  bil pes planus R>L, R hallux valgus  PALPATION: TTP just superior to navicular insertion  LOWER EXTREMITY ROM:  A/P ROM Right eval Left eval  Ankle dorsiflexion 6/10 -4/5  Ankle plantarflexion 56 60  Ankle inversion 24/42 41  Ankle eversion 6/16 19   (Blank rows = not tested)  LOWER EXTREMITY MMT:  MMT Right eval Left eval  Hip flexion 5   Hip extension    Hip abduction    Hip adduction    Hip internal rotation    Hip external rotation    Knee flexion 5   Knee extension 5  Ankle dorsiflexion 5   Ankle plantarflexion 2+   Ankle inversion 5   Ankle eversion 5    (Blank rows = not tested)  LOWER EXTREMITY SPECIAL TESTS:    FUNCTIONAL TESTS:  SLS R 1-2 seconds, L 15 sec plus  GAIT: Distance walked: 20 Assistive device utilized: None Level of assistance: Complete Independence Comments: R hip ER and some pain in ant medial ankle   TODAY'S TREATMENT:                                                                                                                               DATE:   02/06/23    01/30/23 See pt ed and HEP   PATIENT EDUCATION:  Education details: PT eval findings, anticipated POC, and initial HEP  Person educated: Patient Education method: Explanation, Demonstration, and Handouts Education comprehension: verbalized understanding and returned demonstration  HOME EXERCISE PROGRAM: Access Code: [facility] URL: [email or web address] Date: 01/30/2023 Prepared by: Raynelle Fanning  Exercises - Gastroc Stretch on Wall  - 2 x daily - 7 x weekly - 1 sets - 3 reps - 30 hold - Standing Heel Raises  - 1 x daily - 7 x weekly - 3 sets - 10 reps - 3 sec hold - Seated Calf Raise With Small Ball at Heels  - 1 x daily - 7 x weekly - 1 sets - 10 reps - Supine Ankle Circles  - 1 x daily - 7 x weekly - 1 sets - 10 reps  ASSESSMENT:  CLINICAL IMPRESSION: *** .   OBJECTIVE IMPAIRMENTS: decreased activity tolerance, decreased balance, decreased ROM, decreased strength, increased edema, impaired flexibility, postural dysfunction, and pain.   ACTIVITY LIMITATIONS: stairs and locomotion level  PARTICIPATION LIMITATIONS: community activity  PERSONAL FACTORS: Time since onset of injury/illness/exacerbation are also affecting patient's functional outcome.   REHAB POTENTIAL: Good  CLINICAL DECISION MAKING: Stable/uncomplicated  EVALUATION COMPLEXITY: Low   GOALS: Goals reviewed with patient? Yes  SHORT TERM GOALS: Target date: 02/13/2023 Patient will be independent with initial HEP. Baseline:  Goal status: INITIAL  LONG TERM GOALS: Target date: 03/13/2023  Patient will be independent with advanced/ongoing HEP to improve outcomes and carryover.  Baseline:  Goal status: INITIAL  2.  Patient will report at least 85% improvement in R ankle pain to improve QOL. Baseline:  Goal status: INITIAL  3.  Patient will demonstrate improved R ankle active INV/EVER by 10 deg  to allow for normal mechanics. Baseline: see objective  chart Goal status: INITIAL  4.  Patient will demonstrate improved functional LE strength as demonstrated by ability to perform single heel raise on R LE. Baseline:  Goal status: INITIAL  5.  Patient will be able to ambulate for >= 45 min without having to stop from pain.  Baseline:  Goal status: INITIAL  6. Patient will be able to descend stairs with 1 HR and reciprocal step pattern safely to access home and community.  Baseline:  Goal  status: INITIAL  7.  Patient will report 55 on LEFS  to demonstrate improved functional ability. Baseline: 71 / 80 = 88.8 % Goal status: INITIAL  8.  Patient able to balance for >= 5 sec or more on R LE demonstrating improved strength . Baseline:  Goal status: INITIAL     PLAN:  PT FREQUENCY: 1x/week  PT DURATION: 6 weeks  PLANNED INTERVENTIONS: Therapeutic exercises, Therapeutic activity, Neuromuscular re-education, Balance training, Gait training, Patient/Family education, Self Care, Joint mobilization, Stair training, Dry Needling, Electrical stimulation, Cryotherapy, Moist heat, Taping, Vasopneumatic device, Ultrasound, Ionotophoresis 4mg /ml Dexamethasone, and Manual therapy  PLAN FOR NEXT SESSION: Eccentric heel raises with ball and 3 sec TEMPO, Balance, descending stairs, Inv/Ever ROM, ionto?   Solon Palm, PT  02/05/2023, 3:26 PM

## 2023-02-06 ENCOUNTER — Ambulatory Visit: Payer: Medicare HMO

## 2023-02-06 DIAGNOSIS — M25571 Pain in right ankle and joints of right foot: Secondary | ICD-10-CM | POA: Diagnosis not present

## 2023-02-06 DIAGNOSIS — M25671 Stiffness of right ankle, not elsewhere classified: Secondary | ICD-10-CM

## 2023-02-06 DIAGNOSIS — M6281 Muscle weakness (generalized): Secondary | ICD-10-CM

## 2023-02-06 DIAGNOSIS — M76821 Posterior tibial tendinitis, right leg: Secondary | ICD-10-CM | POA: Diagnosis not present

## 2023-02-06 NOTE — Therapy (Signed)
OUTPATIENT PHYSICAL THERAPY LOWER EXTREMITY TREATMENT   Patient Name: Sabrina Abbott MRN: [id number] DOB:1951/03/02, 72 y.o., female Today's Date: 02/06/2023  END OF SESSION:  PT End of Session - 02/06/23 1140     Visit Number 2    Date for PT Re-Evaluation 03/13/23    Authorization Type [employer] MCR    Progress Note Due on Visit 10    PT Start Time 1142    PT Stop Time 1227    PT Time Calculation (min) 45 min    Activity Tolerance Patient tolerated treatment well    Behavior During Therapy [facility] for tasks assessed/performed             Past Medical History:  Diagnosis Date   Allergy 07-09-2013   Seasonal   Blood transfusion without reported diagnosis 12-03-2001   ASCT   Breast cancer (HCC)    Cancer (HCC)    breast   GERD (gastroesophageal reflux disease)    Hypertension    Leucopenia    Vitamin D deficiency    Past Surgical History:  Procedure Laterality Date   BREAST SURGERY  05-24-2001   Modified rt mastectomy   LIMBAL STEM CELL TRANSPLANT     MASTECTOMY Right    MODIFIED RADICAL MASTECTOMY Right    TUBAL LIGATION     WISDOM TOOTH EXTRACTION     Patient Active Problem List   Diagnosis Date Noted   Posterior tibial tendonitis of right leg 08/30/2022   Nutritional counseling 10/27/2019   Colon cancer screening 03/25/2017   Elevated liver enzymes 03/05/2017   Leucopenia 03/05/2017   Malignant neoplasm of right breast (HCC) 12/22/2014   Cough 09/03/2014   Angioedema 07/16/2014   Restrictive ventilatory defect 07/16/2014   Dermatofibroma of deltoid region 09/25/2012   Hypertension 05/05/2012   GERD (gastroesophageal reflux disease) 05/05/2012   History of breast cancer 05/05/2012   Nail abnormality 05/05/2012    PCP: Christen Butter, NP   REFERRING PROVIDER: Louann Sjogren, DPM   REFERRING DIAG: [phone] (ICD-10-CM) - Posterior tibial tendon dysfunction (PTTD) of right lower extremity   THERAPY DIAG:  Pain in right ankle and joints of right  foot  Stiffness of right ankle, not elsewhere classified  Muscle weakness (generalized)  Rationale for Evaluation and Treatment: Rehabilitation  ONSET DATE: Sept 2023  SUBJECTIVE:   SUBJECTIVE STATEMENT: Patient reports she was at a service earlier and was standing for an hour in wedge sandals and her feet are throbbing now.   EVAL: Thought it was plantar faciitis. She was icing and got inserts but nothing helped. She has new orthotics. Pain is mainly anterior to medial malleolus. Ankle feels stable with her orthotics. She had zero pain for three days with Meloxicam but then bothered her stomach so she takes once per week. Used to walk 1 hour/3 miles and now she is limited to 30 min. Has to walk on her heel descending stairs in the morning with step to gait pattern. Going up normal. Really feels if walking on tip toes.  PERTINENT HISTORY: Breast CA 2002 PAIN:  Are you having pain? Yes: NPRS scale: 5/10 Pain location: R ant of medial malleolus Pain description: throbbing and sharp Aggravating factors: walking Relieving factors: NWB and ice  PRECAUTIONS: None  RED FLAGS: None   WEIGHT BEARING RESTRICTIONS: No  FALLS:  Has patient fallen in last 6 months? Yes. Number of falls 1 walking to the mailbox and stepped on uneven sidewalk  LIVING ENVIRONMENT: Lives with: lives with their family Lives  in: House/apartment Stairs: Yes: Internal: 15 steps; on right going up and External: 0 steps; n/a Has following equipment at home: None  OCCUPATION: retired  PLOF: Independent  PATIENT GOALS: wants to learn specific exercises  NEXT MD VISIT: September  OBJECTIVE:   DIAGNOSTIC FINDINGS:  XR 09/01/22 IMPRESSION: 1. Hallux valgus and 1st MTP joint degenerative changes. 2. Pes planus. 3. Moderate-sized inferior calcaneal enthesophyte.  PATIENT SURVEYS:  LEFS 71 / 80 = 88.8 %  COGNITION: Overall cognitive status: Within functional limits for tasks  assessed     SENSATION: WFL  EDEMA:  Occasional swelling at the end of the day  MUSCLE LENGTH: Tight heel cords B  POSTURE:  bil pes planus R>L, R hallux valgus  PALPATION: TTP just superior to navicular insertion  LOWER EXTREMITY ROM:  A/P ROM Right eval Left eval  Ankle dorsiflexion 6/10 -4/5  Ankle plantarflexion 56 60  Ankle inversion 24/42 41  Ankle eversion 6/16 19   (Blank rows = not tested)  LOWER EXTREMITY MMT:  MMT Right eval Left eval  Hip flexion 5   Hip extension    Hip abduction    Hip adduction    Hip internal rotation    Hip external rotation    Knee flexion 5   Knee extension 5   Ankle dorsiflexion 5   Ankle plantarflexion 2+   Ankle inversion 5   Ankle eversion 5    (Blank rows = not tested)  LOWER EXTREMITY SPECIAL TESTS:    FUNCTIONAL TESTS:  SLS R 1-2 seconds, L 15 sec plus  GAIT: Distance walked: 20 Assistive device utilized: None Level of assistance: Complete Independence Comments: R hip ER and some pain in ant medial ankle   TODAY'S TREATMENT:   [facility] Adult PT Treatment:                                                DATE: 02/06/2023 Therapeutic Exercise: Runner's stretch (discontinued d/t pain) BAPS board L2 DF/PF, EV/IN, circles CW/CCW Inver/Ever foot slides on towel  Ankle circles --> Ankle ABCs Long sit calf stretch with pillowcase 3x30" Standing eccentric heel raises with orange ball b/w ankles x12 Modified R woodpecker x5 Seated eccentric heel raise (R) 6#DB resting on knee x10 Seated single leg hip abd GTB 2x10 (B) Standing glute med isometric against wall 10x3" (R)                                                                                                                              DATE:   01/30/23 See pt ed and HEP   PATIENT EDUCATION:  Education details: PT eval findings, anticipated POC, and initial HEP  Person educated: Patient Education method: Explanation, Demonstration, and Handouts Education  comprehension: verbalized understanding and returned demonstration  HOME EXERCISE PROGRAM: Access Code: [facility] URL:  [email or web address] Date: 02/06/2023 Prepared by: Carlynn Herald  Exercises - Gastroc Stretch on Wall  - 2 x daily - 7 x weekly - 1 sets - 3 reps - 30 hold - Standing Heel Raises  - 1 x daily - 7 x weekly - 3 sets - 10 reps - 3 sec hold - Seated Calf Raise With Small Ball at Heels  - 1 x daily - 7 x weekly - 1 sets - 10 reps - Supine Ankle Circles  - 1 x daily - 7 x weekly - 1 sets - 10 reps - Ankle Eversion with Resistance  - 1 x daily - 7 x weekly - 3 sets - 10 reps - Ankle Inversion with Resistance  - 1 x daily - 7 x weekly - 3 sets - 10 reps - Isometric Gluteus Medius at Wall  - 1 x daily - 7 x weekly - 2 sets - 10 reps - 3-5 sec hold - Long Sitting Calf Stretch with Strap  - 1 x daily - 7 x weekly - 1 sets - 3-5 reps - 10-30 sec hold  ASSESSMENT:  CLINICAL IMPRESSION: Ankle mobility and strengthening exercises continued; eccentric heel raises progressed to standing. Glute strengthening targeted with isometric glute med press against wall. Gastroc stretch modified from standing lunge to long sit position due to alleviate pain in heel cord.  EVAL: Patient is a 72 y.o. female who was seen today for physical therapy evaluation and treatment for R posterior tibialis tendon dysfunction. She has pain with walking and descending stairs. She has limitations in R ankle ROM and strength and single leg balance deficits on the R. She will benefit from skilled PT to address these deficits.    OBJECTIVE IMPAIRMENTS: decreased activity tolerance, decreased balance, decreased ROM, decreased strength, increased edema, impaired flexibility, postural dysfunction, and pain.   ACTIVITY LIMITATIONS: stairs and locomotion level  PARTICIPATION LIMITATIONS: community activity  PERSONAL FACTORS: Time since onset of injury/illness/exacerbation are also affecting patient's  functional outcome.   REHAB POTENTIAL: Good  CLINICAL DECISION MAKING: Stable/uncomplicated  EVALUATION COMPLEXITY: Low   GOALS: Goals reviewed with patient? Yes  SHORT TERM GOALS: Target date: 02/13/2023 Patient will be independent with initial HEP. Baseline:  Goal status: INITIAL  LONG TERM GOALS: Target date: 03/13/2023  Patient will be independent with advanced/ongoing HEP to improve outcomes and carryover.  Baseline:  Goal status: INITIAL  2.  Patient will report at least 85% improvement in R ankle pain to improve QOL. Baseline:  Goal status: INITIAL  3.  Patient will demonstrate improved R ankle active INV/EVER by 10 deg  to allow for normal mechanics. Baseline: see objective chart Goal status: INITIAL  4.  Patient will demonstrate improved functional LE strength as demonstrated by ability to perform single heel raise on R LE. Baseline:  Goal status: INITIAL  5.  Patient will be able to ambulate for >= 45 min without having to stop from pain.  Baseline:  Goal status: INITIAL  6. Patient will be able to descend stairs with 1 HR and reciprocal step pattern safely to access home and community.  Baseline:  Goal status: INITIAL  7.  Patient will report 60 on LEFS  to demonstrate improved functional ability. Baseline: 71 / 80 = 88.8 % Goal status: INITIAL  8.  Patient able to balance for >= 5 sec or more on R LE demonstrating improved strength . Baseline:  Goal status: INITIAL    PLAN:  PT FREQUENCY: 1x/week  PT DURATION: 6  weeks  PLANNED INTERVENTIONS: Therapeutic exercises, Therapeutic activity, Neuromuscular re-education, Balance training, Gait training, Patient/Family education, Self Care, Joint mobilization, Stair training, Dry Needling, Electrical stimulation, Cryotherapy, Moist heat, Taping, Vasopneumatic device, Ultrasound, Ionotophoresis 4mg /ml Dexamethasone, and Manual therapy  PLAN FOR NEXT SESSION: STG next visit. Eccentric heel raises, eccentric  exercises. Balance, descending stairs, Inv/Ever ROM   Carlynn Herald, PTA 02/06/2023, 12:28 PM

## 2023-02-12 ENCOUNTER — Encounter: Payer: Self-pay | Admitting: Physical Therapy

## 2023-02-12 ENCOUNTER — Ambulatory Visit: Payer: Medicare HMO | Attending: Podiatry | Admitting: Physical Therapy

## 2023-02-12 DIAGNOSIS — M25571 Pain in right ankle and joints of right foot: Secondary | ICD-10-CM | POA: Insufficient documentation

## 2023-02-12 DIAGNOSIS — M25671 Stiffness of right ankle, not elsewhere classified: Secondary | ICD-10-CM | POA: Diagnosis not present

## 2023-02-12 DIAGNOSIS — M6281 Muscle weakness (generalized): Secondary | ICD-10-CM | POA: Diagnosis not present

## 2023-02-12 NOTE — Therapy (Signed)
OUTPATIENT PHYSICAL THERAPY LOWER EXTREMITY TREATMENT   Patient Name: Sabrina Abbott MRN: [id number] DOB:09-11-1950, 72 y.o., female Today's Date: 02/12/2023  END OF SESSION:  PT End of Session - 02/12/23 1146     Visit Number 3    Date for PT Re-Evaluation 03/13/23    Authorization Type [employer] MCR    Progress Note Due on Visit 10    PT Start Time 1146    PT Stop Time 1230    PT Time Calculation (min) 44 min    Activity Tolerance Patient tolerated treatment well    Behavior During Therapy [facility] for tasks assessed/performed              Past Medical History:  Diagnosis Date   Allergy 07-09-2013   Seasonal   Blood transfusion without reported diagnosis 12-03-2001   ASCT   Breast cancer (HCC)    Cancer (HCC)    breast   GERD (gastroesophageal reflux disease)    Hypertension    Leucopenia    Vitamin D deficiency    Past Surgical History:  Procedure Laterality Date   BREAST SURGERY  05-24-2001   Modified rt mastectomy   LIMBAL STEM CELL TRANSPLANT     MASTECTOMY Right    MODIFIED RADICAL MASTECTOMY Right    TUBAL LIGATION     WISDOM TOOTH EXTRACTION     Patient Active Problem List   Diagnosis Date Noted   Posterior tibial tendonitis of right leg 08/30/2022   Nutritional counseling 10/27/2019   Colon cancer screening 03/25/2017   Elevated liver enzymes 03/05/2017   Leucopenia 03/05/2017   Malignant neoplasm of right breast (HCC) 12/22/2014   Cough 09/03/2014   Angioedema 07/16/2014   Restrictive ventilatory defect 07/16/2014   Dermatofibroma of deltoid region 09/25/2012   Hypertension 05/05/2012   GERD (gastroesophageal reflux disease) 05/05/2012   History of breast cancer 05/05/2012   Nail abnormality 05/05/2012    PCP: Sabrina Butter, NP   REFERRING PROVIDER: Louann Abbott, DPM   REFERRING DIAG: [phone] (ICD-10-CM) - Posterior tibial tendon dysfunction (PTTD) of right lower extremity   THERAPY DIAG:  Pain in right ankle and joints of right  foot  Stiffness of right ankle, not elsewhere classified  Muscle weakness (generalized)  Rationale for Evaluation and Treatment: Rehabilitation  ONSET DATE: Sept 2023  SUBJECTIVE:   SUBJECTIVE STATEMENT: Pt thinks things are coming along with her ankle. Has been doing exercises daily.  EVAL: Thought it was plantar faciitis. She was icing and got inserts but nothing helped. She has new orthotics. Pain is mainly anterior to medial malleolus. Ankle feels stable with her orthotics. She had zero pain for three days with Meloxicam but then bothered her stomach so she takes once per week. Used to walk 1 hour/3 miles and now she is limited to 30 min. Has to walk on her heel descending stairs in the morning with step to gait pattern. Going up normal. Really feels if walking on tip toes.  PERTINENT HISTORY: Breast CA 2002 PAIN:  Are you having pain? Yes: NPRS scale: <1 current/10 Pain location: R ant of medial malleolus Pain description: throbbing and sharp Aggravating factors: walking Relieving factors: NWB and ice  PRECAUTIONS: None  RED FLAGS: None   WEIGHT BEARING RESTRICTIONS: No  FALLS:  Has patient fallen in last 6 months? Yes. Number of falls 1 walking to the mailbox and stepped on uneven sidewalk  LIVING ENVIRONMENT: Lives with: lives with their family Lives in: House/apartment Stairs: Yes: Internal: 15 steps; on  right going up and External: 0 steps; n/a Has following equipment at home: None  OCCUPATION: retired  PLOF: Independent  PATIENT GOALS: wants to learn specific exercises  NEXT MD VISIT: September  OBJECTIVE:   DIAGNOSTIC FINDINGS:  XR 09/01/22 IMPRESSION: 1. Hallux valgus and 1st MTP joint degenerative changes. 2. Pes planus. 3. Moderate-sized inferior calcaneal enthesophyte.  PATIENT SURVEYS:  LEFS 71 / 80 = 88.8 %  COGNITION: Overall cognitive status: Within functional limits for tasks assessed     SENSATION: WFL  EDEMA:  Occasional  swelling at the end of the day  MUSCLE LENGTH: Tight heel cords B  POSTURE:  bil pes planus R>L, R hallux valgus  PALPATION: TTP just superior to navicular insertion  LOWER EXTREMITY ROM:  A/P ROM Right eval Left eval  Ankle dorsiflexion 6/10 -4/5  Ankle plantarflexion 56 60  Ankle inversion 24/42 41  Ankle eversion 6/16 19   (Blank rows = not tested)  LOWER EXTREMITY MMT:  MMT Right eval Left eval  Hip flexion 5   Hip extension    Hip abduction    Hip adduction    Hip internal rotation    Hip external rotation    Knee flexion 5   Knee extension 5   Ankle dorsiflexion 5   Ankle plantarflexion 2+   Ankle inversion 5   Ankle eversion 5    (Blank rows = not tested)  LOWER EXTREMITY SPECIAL TESTS:    FUNCTIONAL TESTS:  SLS R 1-2 seconds, L 15 sec plus  GAIT: Distance walked: 20 Assistive device utilized: None Level of assistance: Complete Independence Comments: R hip ER and some pain in ant medial ankle   TODAY'S TREATMENT:   [facility] Adult PT Treatment:                                                DATE: 02/12/23 Therapeutic Exercise: Nustep L5 x 5 min LEs only Gastroc stretch on step x 30 sec Soleus stretch on step x 30 sec Standing eccentric heel raises with orange ball b/w heels 2x10 Standing heel raise with toes out 2x10 Seated arch lift 2x10 Seated ankle inversion green TB eccentrics 2x10 Seated ankle eversion green TB eccentrics 2x10 Sidelying clamshell green TB 2x10 Attempted standing fire hydrants but pt not obtaining glute med activation  Manual Therapy: STM post tib    [facility] Adult PT Treatment:                                                DATE: 02/06/2023 Therapeutic Exercise: Runner's stretch (discontinued d/t pain) BAPS board L2 DF/PF, EV/IN, circles CW/CCW Inver/Ever foot slides on towel  Ankle circles --> Ankle ABCs Long sit calf stretch with pillowcase 3x30" Standing eccentric heel raises with orange ball b/w ankles x12 Modified R  woodpecker x5 Seated eccentric heel raise (R) 6#DB resting on knee x10 Seated single leg hip abd GTB 2x10 (B) Standing glute med isometric against wall 10x3" (R)  DATE:   01/30/23 See pt ed and HEP   PATIENT EDUCATION:  Education details: HEP modifications/updates, icing and self massage at home  Person educated: Patient Education method: Explanation, Demonstration, and Handouts Education comprehension: verbalized understanding and returned demonstration  HOME EXERCISE PROGRAM: Access Code: [facility] URL: [email or web address] Date: 02/06/2023 Prepared by: Carlynn Herald  Exercises - Gastroc Stretch on Wall  - 2 x daily - 7 x weekly - 1 sets - 3 reps - 30 hold - Standing Heel Raises  - 1 x daily - 7 x weekly - 3 sets - 10 reps - 3 sec hold - Seated Calf Raise With Small Ball at Heels  - 1 x daily - 7 x weekly - 1 sets - 10 reps - Supine Ankle Circles  - 1 x daily - 7 x weekly - 1 sets - 10 reps - Ankle Eversion with Resistance  - 1 x daily - 7 x weekly - 3 sets - 10 reps - Ankle Inversion with Resistance  - 1 x daily - 7 x weekly - 3 sets - 10 reps - Isometric Gluteus Medius at Wall  - 1 x daily - 7 x weekly - 2 sets - 10 reps - 3-5 sec hold - Long Sitting Calf Stretch with Strap  - 1 x daily - 7 x weekly - 1 sets - 3-5 reps - 10-30 sec hold  ASSESSMENT:  CLINICAL IMPRESSION: Modified HEP for improved pt comfort. 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 Initiated arch lifting exercises -- pt with difficulty initially but able to obtain good contraction by end of session. Pt reports she has been consistent with her HEP and has met her STG  EVAL: Patient is a 72 y.o. female who was seen today for physical therapy evaluation and treatment for R posterior tibialis tendon dysfunction. She has pain with walking and descending stairs. She has  limitations in R ankle ROM and strength and single leg balance deficits on the R. She will benefit from skilled PT to address these deficits.    OBJECTIVE IMPAIRMENTS: decreased activity tolerance, decreased balance, decreased ROM, decreased strength, increased edema, impaired flexibility, postural dysfunction, and pain.    GOALS: Goals reviewed with patient? Yes  SHORT TERM GOALS: Target date: 02/13/2023 Patient will be independent with initial HEP. Baseline:  Goal status: MET  LONG TERM GOALS: Target date: 03/13/2023  Patient will be independent with advanced/ongoing HEP to improve outcomes and carryover.  Baseline:  Goal status: INITIAL  2.  Patient will report at least 85% improvement in R ankle pain to improve QOL. Baseline:  Goal status: INITIAL  3.  Patient will demonstrate improved R ankle active INV/EVER by 10 deg  to allow for normal mechanics. Baseline: see objective chart Goal status: INITIAL  4.  Patient will demonstrate improved functional LE strength as demonstrated by ability to perform single heel raise on R LE. Baseline:  Goal status: INITIAL  5.  Patient will be able to ambulate for >= 45 min without having to stop from pain.  Baseline:  Goal status: INITIAL  6. Patient will be able to descend stairs with 1 HR and reciprocal step pattern safely to access home and community.  Baseline:  Goal status: INITIAL  7.  Patient will report 1 on LEFS  to demonstrate improved functional ability. Baseline: 71 / 80 = 88.8 % Goal status: INITIAL  8.  Patient able to balance for >= 5 sec or more on R LE demonstrating improved strength . Baseline:  Goal status: INITIAL    PLAN:  PT FREQUENCY: 1x/week  PT DURATION: 6 weeks  PLANNED INTERVENTIONS: Therapeutic exercises, Therapeutic activity, Neuromuscular re-education, Balance training, Gait training, Patient/Family education, Self Care, Joint mobilization, Stair training, Dry Needling, Electrical stimulation,  Cryotherapy, Moist heat, Taping, Vasopneumatic device, Ultrasound, Ionotophoresis 4mg /ml Dexamethasone, and Manual therapy  PLAN FOR NEXT SESSION: Eccentric heel raises, eccentric exercises. Balance, descending stairs, Inv/Ever ROM   April Ma L , PT 02/12/2023, 11:46 AM

## 2023-02-14 ENCOUNTER — Other Ambulatory Visit: Payer: Self-pay | Admitting: Medical-Surgical

## 2023-02-15 ENCOUNTER — Other Ambulatory Visit: Payer: Self-pay | Admitting: Podiatry

## 2023-02-20 ENCOUNTER — Ambulatory Visit: Payer: Medicare HMO | Admitting: Physical Therapy

## 2023-02-20 ENCOUNTER — Encounter: Payer: Self-pay | Admitting: Physical Therapy

## 2023-02-20 DIAGNOSIS — M6281 Muscle weakness (generalized): Secondary | ICD-10-CM

## 2023-02-20 DIAGNOSIS — M25571 Pain in right ankle and joints of right foot: Secondary | ICD-10-CM

## 2023-02-20 DIAGNOSIS — M25671 Stiffness of right ankle, not elsewhere classified: Secondary | ICD-10-CM

## 2023-02-20 NOTE — Therapy (Signed)
OUTPATIENT PHYSICAL THERAPY LOWER EXTREMITY TREATMENT   Patient Name: Sabrina Abbott MRN: [id number] DOB:1951/05/11, 72 y.o., female Today's Date: 02/20/2023  END OF SESSION:  PT End of Session - 02/20/23 1102     Visit Number 4    Date for PT Re-Evaluation 03/13/23    Authorization Type [employer] MCR    Progress Note Due on Visit 10    PT Start Time 1102    PT Stop Time 1145    PT Time Calculation (min) 43 min    Activity Tolerance Patient tolerated treatment well    Behavior During Therapy [facility] for tasks assessed/performed              Past Medical History:  Diagnosis Date   Allergy 07-09-2013   Seasonal   Blood transfusion without reported diagnosis 12-03-2001   ASCT   Breast cancer (HCC)    Cancer (HCC)    breast   GERD (gastroesophageal reflux disease)    Hypertension    Leucopenia    Vitamin D deficiency    Past Surgical History:  Procedure Laterality Date   BREAST SURGERY  05-24-2001   Modified rt mastectomy   LIMBAL STEM CELL TRANSPLANT     MASTECTOMY Right    MODIFIED RADICAL MASTECTOMY Right    TUBAL LIGATION     WISDOM TOOTH EXTRACTION     Patient Active Problem List   Diagnosis Date Noted   Posterior tibial tendonitis of right leg 08/30/2022   Nutritional counseling 10/27/2019   Colon cancer screening 03/25/2017   Elevated liver enzymes 03/05/2017   Leucopenia 03/05/2017   Malignant neoplasm of right breast (HCC) 12/22/2014   Cough 09/03/2014   Angioedema 07/16/2014   Restrictive ventilatory defect 07/16/2014   Dermatofibroma of deltoid region 09/25/2012   Hypertension 05/05/2012   GERD (gastroesophageal reflux disease) 05/05/2012   History of breast cancer 05/05/2012   Nail abnormality 05/05/2012    PCP: Christen Butter, NP   REFERRING PROVIDER: Louann Sjogren, DPM   REFERRING DIAG: [phone] (ICD-10-CM) - Posterior tibial tendon dysfunction (PTTD) of right lower extremity   THERAPY DIAG:  Pain in right ankle and joints of right  foot  Stiffness of right ankle, not elsewhere classified  Muscle weakness (generalized)  Rationale for Evaluation and Treatment: Rehabilitation  ONSET DATE: Sept 2023  SUBJECTIVE:   SUBJECTIVE STATEMENT: Pt states she's been helping her youngest daughter buy a car. Pt states the biggest thing is first getting out of bed without her shoes on. Pt states she is not taking any pain medication. She can stretch and this helps with her discomfort. Have not gone back to the [facility] to walk but is getting 10,000 steps during the day.  EVAL: Thought it was plantar faciitis. She was icing and got inserts but nothing helped. She has new orthotics. Pain is mainly anterior to medial malleolus. Ankle feels stable with her orthotics. She had zero pain for three days with Meloxicam but then bothered her stomach so she takes once per week. Used to walk 1 hour/3 miles and now she is limited to 30 min. Has to walk on her heel descending stairs in the morning with step to gait pattern. Going up normal. Really feels if walking on tip toes.  PERTINENT HISTORY: Breast CA 2002 PAIN:  Are you having pain? Yes: NPRS scale: 0 currently/10 Pain location: R ant of medial malleolus Pain description: throbbing and sharp Aggravating factors: walking Relieving factors: NWB and ice  PRECAUTIONS: None  RED FLAGS: None  WEIGHT BEARING RESTRICTIONS: No  FALLS:  Has patient fallen in last 6 months? Yes. Number of falls 1 walking to the mailbox and stepped on uneven sidewalk  LIVING ENVIRONMENT: Lives with: lives with their family Lives in: House/apartment Stairs: Yes: Internal: 15 steps; on right going up and External: 0 steps; n/a Has following equipment at home: None  OCCUPATION: retired  PLOF: Independent  PATIENT GOALS: wants to learn specific exercises  NEXT MD VISIT: September  OBJECTIVE:   DIAGNOSTIC FINDINGS:  XR 09/01/22 IMPRESSION: 1. Hallux valgus and 1st MTP joint degenerative changes. 2.  Pes planus. 3. Moderate-sized inferior calcaneal enthesophyte.  PATIENT SURVEYS:  LEFS 71 / 80 = 88.8 %  COGNITION: Overall cognitive status: Within functional limits for tasks assessed     SENSATION: WFL  EDEMA:  Occasional swelling at the end of the day  MUSCLE LENGTH: Tight heel cords B  POSTURE:  bil pes planus R>L, R hallux valgus  PALPATION: TTP just superior to navicular insertion  LOWER EXTREMITY ROM:  A/P ROM Right eval Left eval  Ankle dorsiflexion 6/10 -4/5  Ankle plantarflexion 56 60  Ankle inversion 24/42 41  Ankle eversion 6/16 19   (Blank rows = not tested)  LOWER EXTREMITY MMT:  MMT Right eval Left eval  Hip flexion 5   Hip extension    Hip abduction    Hip adduction    Hip internal rotation    Hip external rotation    Knee flexion 5   Knee extension 5   Ankle dorsiflexion 5   Ankle plantarflexion 2+   Ankle inversion 5   Ankle eversion 5    (Blank rows = not tested)  LOWER EXTREMITY SPECIAL TESTS:    FUNCTIONAL TESTS:  SLS R 1-2 seconds, L 15 sec plus  GAIT: Distance walked: 20 Assistive device utilized: None Level of assistance: Complete Independence Comments: R hip ER and some pain in ant medial ankle   TODAY'S TREATMENT:   [facility] Adult PT Treatment:                                                DATE: 02/20/23 Therapeutic Exercise: Recumbent bike L2 x 5 min Gastroc stretch on step x 30 sec Soleus stretch on step x 30 sec Eccentric heel raise on R 2x10 Staggered stance arch lift on R 10x10 sec Arch lift + mini squat 2x10 SLS 5 x 3 sec on R Eccentric inv green TB 2x10 Eccentric ev green TB 2x10 Standing side step red TB 2x10 Self Care: Self myofacial release with tennis ball along arch   [facility] Adult PT Treatment:                                                DATE: 02/12/23 Therapeutic Exercise: Nustep L5 x 5 min LEs only Gastroc stretch on step x 30 sec Soleus stretch on step x 30 sec Standing eccentric heel raises  with orange ball b/w heels 2x10 Standing heel raise with toes out 2x10 Seated arch lift 2x10 Seated ankle inversion green TB eccentrics 2x10 Seated ankle eversion green TB eccentrics 2x10 Sidelying clamshell green TB 2x10 Attempted standing fire hydrants but pt not obtaining glute med activation  Manual Therapy: STM  post tib    [facility] Adult PT Treatment:                                                DATE: 02/06/2023 Therapeutic Exercise: Runner's stretch (discontinued d/t pain) BAPS board L2 DF/PF, EV/IN, circles CW/CCW Inver/Ever foot slides on towel  Ankle circles --> Ankle ABCs Long sit calf stretch with pillowcase 3x30" Standing eccentric heel raises with orange ball b/w ankles x12 Modified R woodpecker x5 Seated eccentric heel raise (R) 6#DB resting on knee x10 Seated single leg hip abd GTB 2x10 (B) Standing glute med isometric against wall 10x3" (R)                                                                                                                              DATE:   01/30/23 See pt ed and HEP   PATIENT EDUCATION:  Education details: HEP modifications/updates, icing and self massage at home  Person educated: Patient Education method: Explanation, Demonstration, and Handouts Education comprehension: verbalized understanding and returned demonstration  HOME EXERCISE PROGRAM: Access Code: [facility] URL: [email or web address] Date: 02/06/2023 Prepared by: Carlynn Herald  Exercises - Gastroc Stretch on Wall  - 2 x daily - 7 x weekly - 1 sets - 3 reps - 30 hold - Standing Heel Raises  - 1 x daily - 7 x weekly - 3 sets - 10 reps - 3 sec hold - Seated Calf Raise With Small Ball at Heels  - 1 x daily - 7 x weekly - 1 sets - 10 reps - Supine Ankle Circles  - 1 x daily - 7 x weekly - 1 sets - 10 reps - Ankle Eversion with Resistance  - 1 x daily - 7 x weekly - 3 sets - 10 reps - Ankle Inversion with Resistance  - 1 x daily - 7 x weekly - 3 sets - 10  reps - Isometric Gluteus Medius at Wall  - 1 x daily - 7 x weekly - 2 sets - 10 reps - 3-5 sec hold - Long Sitting Calf Stretch with Strap  - 1 x daily - 7 x weekly - 1 sets - 3-5 reps - 10-30 sec hold  ASSESSMENT:  CLINICAL IMPRESSION: Continued to progress pt's ankle strengthening as tolerated. Able to perform single leg eccentric heel raises today.   EVAL: Patient is a 72 y.o. female who was seen today for physical therapy evaluation and treatment for R posterior tibialis tendon dysfunction. She has pain with walking and descending stairs. She has limitations in R ankle ROM and strength and single leg balance deficits on the R. She will benefit from skilled PT to address these deficits.    OBJECTIVE IMPAIRMENTS: decreased activity tolerance, decreased balance, decreased ROM, decreased strength, increased edema, impaired flexibility, postural dysfunction, and pain.  GOALS: Goals reviewed with patient? Yes  SHORT TERM GOALS: Target date: 02/13/2023 Patient will be independent with initial HEP. Baseline:  Goal status: MET  LONG TERM GOALS: Target date: 03/13/2023  Patient will be independent with advanced/ongoing HEP to improve outcomes and carryover.  Baseline:  Goal status: INITIAL  2.  Patient will report at least 85% improvement in R ankle pain to improve QOL. Baseline:  Goal status: INITIAL  3.  Patient will demonstrate improved R ankle active INV/EVER by 10 deg  to allow for normal mechanics. Baseline: see objective chart Goal status: INITIAL  4.  Patient will demonstrate improved functional LE strength as demonstrated by ability to perform single heel raise on R LE. Baseline:  Goal status: INITIAL  5.  Patient will be able to ambulate for >= 45 min without having to stop from pain.  Baseline:  Goal status: INITIAL  6. Patient will be able to descend stairs with 1 HR and reciprocal step pattern safely to access home and community.  Baseline:  Goal status:  INITIAL  7.  Patient will report 67 on LEFS  to demonstrate improved functional ability. Baseline: 71 / 80 = 88.8 % Goal status: INITIAL  8.  Patient able to balance for >= 5 sec or more on R LE demonstrating improved strength . Baseline:  Goal status: INITIAL    PLAN:  PT FREQUENCY: 1x/week  PT DURATION: 6 weeks  PLANNED INTERVENTIONS: Therapeutic exercises, Therapeutic activity, Neuromuscular re-education, Balance training, Gait training, Patient/Family education, Self Care, Joint mobilization, Stair training, Dry Needling, Electrical stimulation, Cryotherapy, Moist heat, Taping, Vasopneumatic device, Ultrasound, Ionotophoresis 4mg /ml Dexamethasone, and Manual therapy  PLAN FOR NEXT SESSION: Eccentric heel raises, eccentric exercises. Progress arch lifting. Balance, descending stairs, Inv/Ever ROM   April Ma L , PT 02/20/2023, 11:03 AM

## 2023-02-26 ENCOUNTER — Ambulatory Visit: Payer: Medicare HMO

## 2023-02-26 DIAGNOSIS — M6281 Muscle weakness (generalized): Secondary | ICD-10-CM | POA: Diagnosis not present

## 2023-02-26 DIAGNOSIS — M25571 Pain in right ankle and joints of right foot: Secondary | ICD-10-CM

## 2023-02-26 DIAGNOSIS — M25671 Stiffness of right ankle, not elsewhere classified: Secondary | ICD-10-CM

## 2023-02-26 NOTE — Therapy (Signed)
OUTPATIENT PHYSICAL THERAPY LOWER EXTREMITY TREATMENT   Patient Name: BRYER HIROSE MRN: [id number] DOB:01-Apr-1951, 72 y.o., female Today's Date: 02/26/2023  END OF SESSION:  PT End of Session - 02/26/23 0802     Visit Number 5    Number of Visits 6    Date for PT Re-Evaluation 03/13/23    Authorization Type [employer] MCR    Authorization Time Period 6 VISITS APPROVED FOR PT 01/30/2023-03/13/2023    Authorization - Visit Number 5    Authorization - Number of Visits 6    Progress Note Due on Visit 10    PT Start Time 0802    PT Stop Time 0842    PT Time Calculation (min) 40 min    Activity Tolerance Patient tolerated treatment well    Behavior During Therapy [facility] for tasks assessed/performed              Past Medical History:  Diagnosis Date   Allergy 07-09-2013   Seasonal   Blood transfusion without reported diagnosis 12-03-2001   ASCT   Breast cancer (HCC)    Cancer (HCC)    breast   GERD (gastroesophageal reflux disease)    Hypertension    Leucopenia    Vitamin D deficiency    Past Surgical History:  Procedure Laterality Date   BREAST SURGERY  05-24-2001   Modified rt mastectomy   LIMBAL STEM CELL TRANSPLANT     MASTECTOMY Right    MODIFIED RADICAL MASTECTOMY Right    TUBAL LIGATION     WISDOM TOOTH EXTRACTION     Patient Active Problem List   Diagnosis Date Noted   Posterior tibial tendonitis of right leg 08/30/2022   Nutritional counseling 10/27/2019   Colon cancer screening 03/25/2017   Elevated liver enzymes 03/05/2017   Leucopenia 03/05/2017   Malignant neoplasm of right breast (HCC) 12/22/2014   Cough 09/03/2014   Angioedema 07/16/2014   Restrictive ventilatory defect 07/16/2014   Dermatofibroma of deltoid region 09/25/2012   Hypertension 05/05/2012   GERD (gastroesophageal reflux disease) 05/05/2012   History of breast cancer 05/05/2012   Nail abnormality 05/05/2012    PCP: Christen Butter, NP   REFERRING PROVIDER: Louann Sjogren,  DPM   REFERRING DIAG: [phone] (ICD-10-CM) - Posterior tibial tendon dysfunction (PTTD) of right lower extremity   THERAPY DIAG:  Pain in right ankle and joints of right foot  Stiffness of right ankle, not elsewhere classified  Muscle weakness (generalized)  Rationale for Evaluation and Treatment: Rehabilitation  ONSET DATE: Sept 2023  SUBJECTIVE:   SUBJECTIVE STATEMENT: Patient reports she did a lot of walking yesterday and it felt fine but she is wearing her brace today. Patient denies pain or soreness in ankle, states she is wearing brace for extra ankle support today. Patient states she is able to descend stairs with reciprocal stepping pattern; states being barefoot continues to be biggest difficulty.  EVAL: Thought it was plantar faciitis. She was icing and got inserts but nothing helped. She has new orthotics. Pain is mainly anterior to medial malleolus. Ankle feels stable with her orthotics. She had zero pain for three days with Meloxicam but then bothered her stomach so she takes once per week. Used to walk 1 hour/3 miles and now she is limited to 30 min. Has to walk on her heel descending stairs in the morning with step to gait pattern. Going up normal. Really feels if walking on tip toes.  PERTINENT HISTORY: Breast CA 2002 PAIN:  Are you having pain? Yes:  NPRS scale: 0 currently/10 Pain location: R ant of medial malleolus Pain description: throbbing and sharp Aggravating factors: walking Relieving factors: NWB and ice  PRECAUTIONS: None  RED FLAGS: None   WEIGHT BEARING RESTRICTIONS: No  FALLS:  Has patient fallen in last 6 months? Yes. Number of falls 1 walking to the mailbox and stepped on uneven sidewalk  LIVING ENVIRONMENT: Lives with: lives with their family Lives in: House/apartment Stairs: Yes: Internal: 15 steps; on right going up and External: 0 steps; n/a Has following equipment at home: None  OCCUPATION: retired  PLOF: Independent  PATIENT  GOALS: wants to learn specific exercises  NEXT MD VISIT: 03/22/23  OBJECTIVE:   DIAGNOSTIC FINDINGS:  XR 09/01/22 IMPRESSION: 1. Hallux valgus and 1st MTP joint degenerative changes. 2. Pes planus. 3. Moderate-sized inferior calcaneal enthesophyte.  PATIENT SURVEYS:  LEFS 71 / 80 = 88.8 %  COGNITION: Overall cognitive status: Within functional limits for tasks assessed     SENSATION: WFL  EDEMA:  Occasional swelling at the end of the day  MUSCLE LENGTH: Tight heel cords B  POSTURE:  bil pes planus R>L, R hallux valgus  PALPATION: TTP just superior to navicular insertion  LOWER EXTREMITY ROM:  A/P ROM Right eval Left eval Right   Ankle dorsiflexion 6/10 -4/5   Ankle plantarflexion 56 60   Ankle inversion 24/42 41   Ankle eversion 6/16 19    (Blank rows = not tested)  LOWER EXTREMITY MMT:  MMT Right eval Left eval  Hip flexion 5   Hip extension    Hip abduction    Hip adduction    Hip internal rotation    Hip external rotation    Knee flexion 5   Knee extension 5   Ankle dorsiflexion 5   Ankle plantarflexion 2+   Ankle inversion 5   Ankle eversion 5    (Blank rows = not tested)  LOWER EXTREMITY SPECIAL TESTS:    FUNCTIONAL TESTS:  SLS R 1-2 seconds, L 15 sec plus  GAIT: Distance walked: 20 Assistive device utilized: None Level of assistance: Complete Independence Comments: R hip ER and some pain in ant medial ankle   TODAY'S TREATMENT:   [facility] Adult PT Treatment:                                                DATE: 02/26/2023 Therapeutic Exercise: Recumbent bike L3 x 5 min Gastroc stretch on step x 30" Soleus stretch on step x 30" Eccentric heel raise on R 2x10 R SLB 5", 2x3" (with brace) --> x13" (no brace) Long flight of stairs descend/ascend x 1HHA railing Staggered stance arch lift on R 3x5" --> slight weight shift forward + arch lift 3x10" Arch lift + mini squat 2x10 R SLS heel raises x10 (with brace), x10 (no brace) Eccentric inv  green TB 2x10 Eccentric ev green TB 2x10 Standing side step red TB x4 rounds at counter (crossed at forefoot)   [facility] Adult PT Treatment:                                                DATE: 02/20/23 Therapeutic Exercise: Recumbent bike L2 x 5 min Gastroc stretch on step x 30 sec Soleus  stretch on step x 30 sec Eccentric heel raise on R 2x10 Staggered stance arch lift on R 10x10 sec Arch lift + mini squat 2x10 SLS 5 x 3 sec on R Eccentric inv green TB 2x10 Eccentric ev green TB 2x10 Standing side step red TB 2x10 Self Care: Self myofacial release with tennis ball along arch   [facility] Adult PT Treatment:                                                DATE: 02/12/23 Therapeutic Exercise: Nustep L5 x 5 min LEs only Gastroc stretch on step x 30 sec Soleus stretch on step x 30 sec Standing eccentric heel raises with orange ball b/w heels 2x10 Standing heel raise with toes out 2x10 Seated arch lift 2x10 Seated ankle inversion green TB eccentrics 2x10 Seated ankle eversion green TB eccentrics 2x10 Sidelying clamshell green TB 2x10 Attempted standing fire hydrants but pt not obtaining glute med activation  Manual Therapy: STM post tib                   PATIENT EDUCATION:  Education details: HEP modifications/updates, icing and self massage at home  Person educated: Patient Education method: Explanation, Demonstration, and Handouts Education comprehension: verbalized understanding and returned demonstration  HOME EXERCISE PROGRAM: Access Code: [facility] URL: [email or web address] Date: 02/06/2023 Prepared by: Carlynn Herald  Exercises - Gastroc Stretch on Wall  - 2 x daily - 7 x weekly - 1 sets - 3 reps - 30 hold - Standing Heel Raises  - 1 x daily - 7 x weekly - 3 sets - 10 reps - 3 sec hold - Seated Calf Raise With Small Ball at Heels  - 1 x daily - 7 x weekly - 1 sets - 10 reps - Supine Ankle Circles  - 1 x daily - 7 x weekly - 1 sets - 10 reps - Ankle Eversion  with Resistance  - 1 x daily - 7 x weekly - 3 sets - 10 reps - Ankle Inversion with Resistance  - 1 x daily - 7 x weekly - 3 sets - 10 reps - Isometric Gluteus Medius at Wall  - 1 x daily - 7 x weekly - 2 sets - 10 reps - 3-5 sec hold - Long Sitting Calf Stretch with Strap  - 1 x daily - 7 x weekly - 1 sets - 3-5 reps - 10-30 sec hold  ASSESSMENT:  CLINICAL IMPRESSION: Patient demonstrates good progress with R LE single leg stance strength and stability; most significance improvement seen with single leg balance. Ankle and hip strengthening continued with isometric and dynamic stepping exercises.   OBJECTIVE IMPAIRMENTS: decreased activity tolerance, decreased balance, decreased ROM, decreased strength, increased edema, impaired flexibility, postural dysfunction, and pain.    GOALS: Goals reviewed with patient? Yes  SHORT TERM GOALS: Target date: 02/13/2023 Patient will be independent with initial HEP. Baseline:  Goal status: MET  LONG TERM GOALS: Target date: 03/13/2023  Patient will be independent with advanced/ongoing HEP to improve outcomes and carryover.  Baseline:  Goal status: MET  2.  Patient will report at least 85% improvement in R ankle pain to improve QOL. Baseline: 70% Goal status: IN PROGRESS  3.  Patient will demonstrate improved R ankle active INV/EVER by 10 deg  to allow for normal mechanics. Baseline: see objective chart Goal  status: INITIAL  4.  Patient will demonstrate improved functional LE strength as demonstrated by ability to perform single heel raise on R LE. Baseline: able to raise heel but knee bends Goal status: IN PROGRESS  5.  Patient will be able to ambulate for >= 45 min without having to stop from pain.  Baseline:  Goal status: MET  6. Patient will be able to descend stairs with 1 HR and reciprocal step pattern safely to access home and community.  Baseline:  Goal status: MET  7.  Patient will report 1 on LEFS  to demonstrate improved  functional ability. Baseline: 71 / 80 = 88.8 % Goal status: INITIAL  8.  Patient able to balance for >= 5 sec or more on R LE demonstrating improved strength. Baseline:  Goal status: MET    PLAN:  PT FREQUENCY: 1x/week  PT DURATION: 6 weeks  PLANNED INTERVENTIONS: Therapeutic exercises, Therapeutic activity, Neuromuscular re-education, Balance training, Gait training, Patient/Family education, Self Care, Joint mobilization, Stair training, Dry Needling, Electrical stimulation, Cryotherapy, Moist heat, Taping, Vasopneumatic device, Ultrasound, Ionotophoresis 4mg /ml Dexamethasone, and Manual therapy  PLAN FOR NEXT SESSION: Re-eval next visit. Eccentric heel raises, eccentric exercises. Progress arch lifting. Balance, descending stairs, Inv/Ever ROM  Sanjuana Mae, PTA 02/26/2023, 8:44 AM

## 2023-03-05 ENCOUNTER — Encounter: Payer: Self-pay | Admitting: Physical Therapy

## 2023-03-05 ENCOUNTER — Ambulatory Visit: Payer: Medicare HMO | Admitting: Physical Therapy

## 2023-03-05 DIAGNOSIS — M6281 Muscle weakness (generalized): Secondary | ICD-10-CM

## 2023-03-05 DIAGNOSIS — M25671 Stiffness of right ankle, not elsewhere classified: Secondary | ICD-10-CM

## 2023-03-05 DIAGNOSIS — M25571 Pain in right ankle and joints of right foot: Secondary | ICD-10-CM

## 2023-03-05 NOTE — Therapy (Signed)
OUTPATIENT PHYSICAL THERAPY LOWER EXTREMITY TREATMENT AND DISCHARGE SUMMARY   Patient Name: Sabrina Abbott MRN: [id number] DOB:[birth year], 72 y.o., female Today's Date: 03/05/2023  END OF SESSION:  PT End of Session - 03/05/23 0848     Visit Number 6    Number of Visits 6    Date for PT Re-Evaluation 03/13/23    Authorization Type [employer] MCR    Authorization Time Period 6 VISITS APPROVED FOR PT 01/30/2023-03/13/2023    Authorization - Visit Number 6    Authorization - Number of Visits 6    Progress Note Due on Visit 10    PT Start Time 0849    PT Stop Time 0931    PT Time Calculation (min) 42 min    Activity Tolerance Patient tolerated treatment well    Behavior During Therapy [facility] for tasks assessed/performed               Past Medical History:  Diagnosis Date   Allergy 07-09-2013   Seasonal   Blood transfusion without reported diagnosis 12-03-2001   ASCT   Breast cancer (HCC)    Cancer (HCC)    breast   GERD (gastroesophageal reflux disease)    Hypertension    Leucopenia    Vitamin D deficiency    Past Surgical History:  Procedure Laterality Date   BREAST SURGERY  05-24-2001   Modified rt mastectomy   LIMBAL STEM CELL TRANSPLANT     MASTECTOMY Right    MODIFIED RADICAL MASTECTOMY Right    TUBAL LIGATION     WISDOM TOOTH EXTRACTION     Patient Active Problem List   Diagnosis Date Noted   Posterior tibial tendonitis of right leg 08/30/2022   Nutritional counseling 10/27/2019   Colon cancer screening 03/25/2017   Elevated liver enzymes 03/05/2017   Leucopenia 03/05/2017   Malignant neoplasm of right breast (HCC) 12/22/2014   Cough 09/03/2014   Angioedema 07/16/2014   Restrictive ventilatory defect 07/16/2014   Dermatofibroma of deltoid region 09/25/2012   Hypertension 05/05/2012   GERD (gastroesophageal reflux disease) 05/05/2012   History of breast cancer 05/05/2012   Nail abnormality 05/05/2012    PCP: Christen Butter, NP   REFERRING PROVIDER:  Louann Sjogren, DPM   REFERRING DIAG: [phone] (ICD-10-CM) - Posterior tibial tendon dysfunction (PTTD) of right lower extremity   THERAPY DIAG:  Pain in right ankle and joints of right foot  Stiffness of right ankle, not elsewhere classified  Muscle weakness (generalized)  Rationale for Evaluation and Treatment: Rehabilitation  ONSET DATE: Sept 2023  SUBJECTIVE:   SUBJECTIVE STATEMENT: Able to walk 45 min without brace. I have less pain with brace. I can tell a big difference in the arch exercise. No pain now.  EVAL: Thought it was plantar faciitis. She was icing and got inserts but nothing helped. She has new orthotics. Pain is mainly anterior to medial malleolus. Ankle feels stable with her orthotics. She had zero pain for three days with Meloxicam but then bothered her stomach so she takes once per week. Used to walk 1 hour/3 miles and now she is limited to 30 min. Has to walk on her heel descending stairs in the morning with step to gait pattern. Going up normal. Really feels if walking on tip toes.  PERTINENT HISTORY: Breast CA 2002 PAIN:  Are you having pain? Yes: NPRS scale: 4-5 currently/10 Pain location: R ant of medial malleolus Pain description: throbbing and sharp Aggravating factors: walking Relieving factors: NWB and ice  PRECAUTIONS: None  RED FLAGS: None   WEIGHT BEARING RESTRICTIONS: No  FALLS:  Has patient fallen in last 6 months? Yes. Number of falls 1 walking to the mailbox and stepped on uneven sidewalk  LIVING ENVIRONMENT: Lives with: lives with their family Lives in: House/apartment Stairs: Yes: Internal: 15 steps; on right going up and External: 0 steps; n/a Has following equipment at home: None  OCCUPATION: retired  PLOF: Independent  PATIENT GOALS: wants to learn specific exercises  NEXT MD VISIT: 03/22/23  OBJECTIVE:   DIAGNOSTIC FINDINGS:  XR 09/01/22 IMPRESSION: 1. Hallux valgus and 1st MTP joint degenerative changes. 2. Pes  planus. 3. Moderate-sized inferior calcaneal enthesophyte.  PATIENT SURVEYS:  LEFS 71 / 80 = 88.8 %  COGNITION: Overall cognitive status: Within functional limits for tasks assessed     SENSATION: WFL  EDEMA:  Occasional swelling at the end of the day  MUSCLE LENGTH: Tight heel cords B  POSTURE:  bil pes planus R>L, R hallux valgus  PALPATION: TTP just superior to navicular insertion  LOWER EXTREMITY ROM:  A/P ROM Right eval Left eval Right 03/05/23  Ankle dorsiflexion 6/10 -4/5   Ankle plantarflexion 56 60   Ankle inversion 24/42 41 33  Ankle eversion 6/16 19 36   (Blank rows = not tested)  LOWER EXTREMITY MMT:  MMT Right eval Left eval  Hip flexion 5   Hip extension    Hip abduction    Hip adduction    Hip internal rotation    Hip external rotation    Knee flexion 5   Knee extension 5   Ankle dorsiflexion 5   Ankle plantarflexion 2+ 4+  Ankle inversion 5   Ankle eversion 5    (Blank rows = not tested)  LOWER EXTREMITY SPECIAL TESTS:    FUNCTIONAL TESTS:  SLS R 1-2 seconds, L 15 sec plus  GAIT: Distance walked: 20 Assistive device utilized: None Level of assistance: Complete Independence Comments: R hip ER and some pain in ant medial ankle   TODAY'S TREATMENT:   [facility] Adult PT Treatment:                                                DATE: 02/26/2023 Therapeutic Exercise: Recumbent bike L3 x 5 min Therapeutic Activities: LEFS, ROM, strength testing Unilateral heel raises x 10 ea Seated ankle ever/inv x 20 ea with ball between knees Star taps R LE x 5 Tandem walking fwd/bwd x 20 ft ea Staggered stance arch lift on R  --> slight weight shift forward + arch lift 5x10" Arch lift + mini squat 2x10 On step B eccentric heel raise x 20, tried unilaterally too difficult Standing side step red TB x4 rounds at counter (crossed at forefoot)  [facility] Adult PT Treatment:                                                DATE: 02/26/2023 Therapeutic  Exercise: Recumbent bike L3 x 5 min Gastroc stretch on step x 30" Soleus stretch on step x 30" Eccentric heel raise on R 2x10 R SLB 5", 2x3" (with brace) --> x13" (no brace) Long flight of stairs descend/ascend x 1HHA railing Staggered stance arch lift on R 3x5" --> slight weight  shift forward + arch lift 3x10" Arch lift + mini squat 2x10 R SLS heel raises x10 (with brace), x10 (no brace) Eccentric inv green TB 2x10 Eccentric ev green TB 2x10 Standing side step red TB x4 rounds at counter (crossed at forefoot)   [facility] Adult PT Treatment:                                                DATE: 02/20/23 Therapeutic Exercise: Recumbent bike L2 x 5 min Gastroc stretch on step x 30 sec Soleus stretch on step x 30 sec Eccentric heel raise on R 2x10 Staggered stance arch lift on R 10x10 sec Arch lift + mini squat 2x10 SLS 5 x 3 sec on R Eccentric inv green TB 2x10 Eccentric ev green TB 2x10 Standing side step red TB 2x10 Self Care: Self myofacial release with tennis ball along arch                  PATIENT EDUCATION:  Education details: HEP modifications/updates, icing and self massage at home  Person educated: Patient Education method: Explanation, Demonstration, and Handouts Education comprehension: verbalized understanding and returned demonstration  HOME EXERCISE PROGRAM: Access Code: [facility] URL: [email or web address] Date: 03/05/2023 Prepared by: Sabrina Abbott  Exercises - Seated Calf Raise With Small Ball at Heels  - 1 x daily - 7 x weekly - 1 sets - 10 reps - Ankle Eversion with Resistance  - 1 x daily - 7 x weekly - 3 sets - 10 reps - Ankle Inversion with Resistance  - 1 x daily - 7 x weekly - 3 sets - 10 reps - Standing Gastroc Stretch on Step  - 1 x daily - 7 x weekly - 2 sets - 30 sec hold - Standing Soleus Stretch on Step  - 1 x daily - 7 x weekly - 2 sets - 30 sec hold - Eccentric Heel Lowering on Step  - 1 x daily - 7 x weekly - 2 sets - 10 reps - Side  Stepping with Resistance at Ankles  - 1 x daily - 7 x weekly - 2 sets - 10 reps - Arch Lifting  - 1 x daily - 7 x weekly - 2 sets - 10 reps - 10 sec hold - Single Leg Balance with Clock Reach  - 1 x daily - 3-4 x weekly - 1 sets - 10 reps - Seated Ankle Inversion Eversion AROM  - 1 x daily - 3 x weekly - 3 sets - 10 reps  ASSESSMENT:  CLINICAL IMPRESSION: Sabrina Abbott reports 70% improvement overall in R foot pain. She demonstrates full ROM and is now able to perform at least 10 single leg heel raises on the R without pain. She continues to report some pain with walking, but can now tolerate walking 45 min without her brace. Previously she was walking for an hour. She does demonstrate some single leg balance deficits on the R with higher level activities and these were issued to her HEP so that she can progress balance at home.  Lakelee is pleased with her current functional level and was discharged to her HEP today.   OBJECTIVE IMPAIRMENTS: decreased activity tolerance, decreased balance, decreased ROM, decreased strength, increased edema, impaired flexibility, postural dysfunction, and pain.    GOALS: Goals reviewed with patient? Yes  SHORT TERM GOALS: Target date: 02/13/2023  Patient will be independent with initial HEP. Baseline:  Goal status: MET  LONG TERM GOALS: Target date: 03/13/2023  Patient will be independent with advanced/ongoing HEP to improve outcomes and carryover.  Baseline:  Goal status: MET  2.  Patient will report at least 85% improvement in R ankle pain to improve QOL. Baseline: Goal status: NOT MET 8/27 70% better  3.  Patient will demonstrate improved R ankle active INV/EVER by 10 deg  to allow for normal mechanics. Baseline: see objective chart Goal status: MET  4.  Patient will demonstrate improved functional LE strength as demonstrated by ability to perform single heel raise on R LE. Baseline: able to raise heel but knee bends Goal status: MET  5.  Patient  will be able to ambulate for >= 45 min without having to stop from pain.  Baseline:  Goal status: MET  6. Patient will be able to descend stairs with 1 HR and reciprocal step pattern safely to access home and community.  Baseline:  Goal status: MET  7.  Patient will report 28 on LEFS  to demonstrate improved functional ability. Baseline: 71 / 80 = 88.8 % Goal status: NOT MET 77 / 80 = 96.3 %  8.  Patient able to balance for >= 5 sec or more on R LE demonstrating improved strength. Baseline:  Goal status: MET    PLAN:  PT FREQUENCY: 1x/week  PT DURATION: 6 weeks  PLANNED INTERVENTIONS: Therapeutic exercises, Therapeutic activity, Neuromuscular re-education, Balance training, Gait training, Patient/Family education, Self Care, Joint mobilization, Stair training, Dry Needling, Electrical stimulation, Cryotherapy, Moist heat, Taping, Vasopneumatic device, Ultrasound, Ionotophoresis 4mg /ml Dexamethasone, and Manual therapy  PLAN FOR NEXT SESSION: SEE D/C SUMMARY  Solon Palm, PT  03/05/2023, 10:38 AM  PHYSICAL THERAPY DISCHARGE SUMMARY  Visits from Start of Care: 6  Current functional level related to goals / functional outcomes: See above   Remaining deficits: See above   Education / Equipment: HEP/TBAND   Patient agrees to discharge. Patient goals were partially met. Patient is being discharged due to being pleased with the current functional level.  Solon Palm, PT 03/05/23 10:38 AM

## 2023-03-06 ENCOUNTER — Other Ambulatory Visit: Payer: Self-pay | Admitting: Medical-Surgical

## 2023-03-14 ENCOUNTER — Other Ambulatory Visit: Payer: Self-pay | Admitting: Oncology

## 2023-03-14 ENCOUNTER — Telehealth: Payer: Self-pay | Admitting: Podiatry

## 2023-03-14 DIAGNOSIS — Z006 Encounter for examination for normal comparison and control in clinical research program: Secondary | ICD-10-CM

## 2023-03-14 NOTE — Telephone Encounter (Signed)
[facility] called and wants to know if we received a request for Rx  meloxicam (MOBIC) 15 MG tablet

## 2023-03-15 ENCOUNTER — Ambulatory Visit
Admission: EM | Admit: 2023-03-15 | Discharge: 2023-03-15 | Disposition: A | Discharge status: Home / Self Care | Payer: Medicare HMO | Attending: Internal Medicine | Admitting: Internal Medicine

## 2023-03-15 DIAGNOSIS — R300 Dysuria: Secondary | ICD-10-CM | POA: Insufficient documentation

## 2023-03-15 LAB — POCT URINALYSIS DIP (MANUAL ENTRY)
Bilirubin, UA: NEGATIVE
Glucose, UA: NEGATIVE mg/dL
Ketones, POC UA: NEGATIVE mg/dL
Nitrite, UA: NEGATIVE
Protein Ur, POC: 30 mg/dL — AB
Spec Grav, UA: 1.015 (ref 1.010–1.025)
Urobilinogen, UA: 0.2 U/dL
pH, UA: 7 (ref 5.0–8.0)

## 2023-03-15 MED ORDER — CEFDINIR 300 MG PO CAPS: 300.0000 mg | ORAL_CAPSULE | Freq: Two times a day (BID) | ORAL | 0 refills | Status: AC

## 2023-03-15 NOTE — Discharge Instructions (Signed)
Your urine sample was consistent with a possible urinary tract infection. You were prescribed Cefdinir which is an antibiotic that is often used to treat urinary tract infections.  Take the prescription as directed. A urine culture has been sent to the lab for further testing.  We will call you with those results.  If the prescription needs to be changed, it will be done so at that time.   Return in 2 to 3 days if no improvement. Please go directly to the Emergency Department immediately should you begin to have any of the following symptoms: persistent fevers, increased pain or persistent nausea/vomiting.

## 2023-03-15 NOTE — ED Provider Notes (Signed)
[facility]  Note:  This document was prepared using Dragon voice recognition software and may include unintentional dictation errors.  MRN: [id number] DOB: [birth year] DATE: 03/15/23   Subjective:  Chief Complaint:  Chief Complaint  Patient presents with   Abdominal Pain     HPI: Sabrina Abbott is a 72 y.o. female presenting for dysuria for the past 5 days. Patient reports pain after urinating starting Monday. She also reports suprapubic pain after urination. She noticed that her urine has been cloudy and malodorous as well. She has not taken anything for her symptoms. She reports frequent UTIs. Denies fever, nausea/vomiting, low back pain, hematuria. Endorses dysuria, suprapubic pain. Presents NAD.  Prior to Admission medications   Medication Sig Start Date End Date Taking? Authorizing Provider  diclofenac (VOLTAREN) 75 MG EC tablet Take 1 tablet (75 mg total) by mouth 2 (two) times daily. Patient not taking: Reported on 01/30/2023 11/28/22   Louann Sjogren, DPM  AMBULATORY NON FORMULARY MEDICATION Please fit patient for a replacement breast prosthesis.  Fax to: [employer], [address], [address], [place], [place], [address], phone number [phone], fax number [phone] 11/09/21   Christen Butter, NP  amLODipine (NORVASC) 2.5 MG tablet TAKE 1 TABLET EVERY DAY 03/06/23   Christen Butter, NP  chlorthalidone (HYGROTON) 25 MG tablet TAKE 1 TABLET EVERY DAY 02/14/23   Christen Butter, NP  meloxicam (MOBIC) 15 MG tablet TAKE 1 TABLET (15 MG TOTAL) BY MOUTH DAILY. 02/15/23   Louann Sjogren, DPM  RABEprazole (ACIPHEX) 20 MG tablet Take 1 tablet (20 mg total) by mouth daily. 08/16/22   Christen Butter, NP  scopolamine (TRANSDERM-SCOP) 1 MG/3DAYS Place 1 patch (1.5 mg total) onto the skin every 3 (three) days. 06/11/22   Christen Butter, NP     Allergies  Allergen Reactions   Iodinated Contrast Media Other (See Comments)    Made one side of her "right side of body hurt  real bad"    Lisinopril Swelling   Iodine    Meloxicam Other (See Comments)    GI upset    Soy Allergy     History:   Past Medical History:  Diagnosis Date   Allergy 07-09-2013   Seasonal   Blood transfusion without reported diagnosis 12-03-2001   ASCT   Breast cancer (HCC)    Cancer (HCC)    breast   GERD (gastroesophageal reflux disease)    Hypertension    Leucopenia    Vitamin D deficiency      Past Surgical History:  Procedure Laterality Date   BREAST SURGERY  05-24-2001   Modified rt mastectomy   LIMBAL STEM CELL TRANSPLANT     MASTECTOMY Right    MODIFIED RADICAL MASTECTOMY Right    TUBAL LIGATION     WISDOM TOOTH EXTRACTION      Family History  Problem Relation Age of Onset   Heart disease Father    Alcohol abuse Father    Hyperlipidemia Father    Hypertension Father    Stroke Father    Prostate cancer Brother    Diabetes Brother    Hypertension Brother    Obesity Brother    Arthritis Mother    Varicose Veins Mother    Heart disease Sister    Cancer Paternal Aunt    Heart disease Sister     Social History   Tobacco Use   Smoking status: Never   Smokeless tobacco: Never  Vaping Use   Vaping status: Never Used  Substance Use Topics   Alcohol use: No   Drug use: No    Review of Systems  Constitutional:  Negative for fever.  Gastrointestinal:  Positive for abdominal pain. Negative for nausea and vomiting.  Genitourinary:  Positive for dysuria. Negative for flank pain, frequency and urgency.  Musculoskeletal:  Negative for back pain.     Objective:   Vitals: BP (!) 160/92 (BP Location: Left Arm)   Pulse 62   Temp (!) 97.5 F (36.4 C)   Resp 16   SpO2 97%   Physical Exam Constitutional:      General: She is not in acute distress.    Appearance: Normal appearance. She is well-developed and overweight. She is not ill-appearing or toxic-appearing.  HENT:     Head: Normocephalic and atraumatic.  Cardiovascular:     Rate and  Rhythm: Normal rate and regular rhythm.     Heart sounds: Normal heart sounds.  Pulmonary:     Effort: Pulmonary effort is normal.     Breath sounds: Normal breath sounds.     Comments: Clear to auscultation bilaterally  Abdominal:     General: Bowel sounds are normal.     Palpations: Abdomen is soft.     Tenderness: There is no abdominal tenderness. There is no right CVA tenderness or left CVA tenderness.  Skin:    General: Skin is warm and dry.  Neurological:     General: No focal deficit present.     Mental Status: She is alert.  Psychiatric:        Mood and Affect: Mood and affect normal.     Results:  Labs: Results for orders placed or performed during the hospital encounter of 03/15/23 (from the past 24 hour(s))  POCT urinalysis dipstick     Status: Abnormal   Collection Time: 03/15/23 11:21 AM  Result Value Ref Range   Color, UA yellow yellow   Clarity, UA hazy (A) clear   Glucose, UA negative negative mg/dL   Bilirubin, UA negative negative   Ketones, POC UA negative negative mg/dL   Spec Grav, UA 1.308 6.578 - 1.025   Blood, UA large (A) negative   pH, UA 7.0 5.0 - 8.0   Protein Ur, POC =30 (A) negative mg/dL   Urobilinogen, UA 0.2 0.2 or 1.0 E.U./dL   Nitrite, UA Negative Negative   Leukocytes, UA Large (3+) (A) Negative    Radiology: No results found.   UC Course/Treatments:  Procedures: Procedures   Medications Ordered in UC: Medications - No data to display   Assessment and Plan :     ICD-10-CM   1. Dysuria  R30.0      Dysuria Afebrile, nontoxic-appearing, NAD. VSS. DDX includes but not limited to: Cystitis, pyelonephritis, nephrolithiasis, BV, candidiasis, STD UA was positive for a large amount of blood as well as a large amount of leukocytes.  Urine culture is pending.  Suspect cystitis.  Cefdinir 300mg  twice daily was prescribed while waiting for culture results.  Will adjust medication as needed based on the urine culture. Strict ED  precautions were given and patient verbalized understanding.  ED Discharge Orders          Ordered    cefdinir (OMNICEF) 300 MG capsule  2 times daily        03/15/23 1128             PDMP not reviewed this encounter.     Cynda Acres, PA-C 03/15/23 1143

## 2023-03-15 NOTE — ED Triage Notes (Signed)
Pt complaints of pain after voiding on Monday with abnormal odor and today pain again after voiding. Denies burning with urination, back pain, N/V/D, F/chills or body aches, urgency or frequency. Home remedies: none

## 2023-03-15 NOTE — Telephone Encounter (Signed)
Opened in error

## 2023-03-18 LAB — URINE CULTURE: Culture: 20000 — AB

## 2023-03-22 ENCOUNTER — Ambulatory Visit: Payer: Medicare HMO | Admitting: Podiatry

## 2023-03-22 ENCOUNTER — Encounter: Payer: Self-pay | Admitting: Podiatry

## 2023-03-22 DIAGNOSIS — M76821 Posterior tibial tendinitis, right leg: Secondary | ICD-10-CM | POA: Diagnosis not present

## 2023-03-22 NOTE — Progress Notes (Signed)
Subjective:  Patient ID: Sabrina Abbott, female    DOB: [birth year],   MRN: [id number]  Chief Complaint  Patient presents with   Foot Pain     Pt presents for a follow up Right foot pain , patient states foot is still improving she gets pain sometimes but overall she is getting better.       72 y.o. female presents for follow-up of right PTTD. Relates it is doing better. Not having excruciating pain but still has some trouble with walking longer distances.   Relates she is able to do a lot more than she was before. Pt helped a lot.  Denies any other pedal complaints. Denies n/v/f/c.   Past Medical History:  Diagnosis Date   Allergy 07-09-2013   Seasonal   Blood transfusion without reported diagnosis 12-03-2001   ASCT   Breast cancer (HCC)    Cancer (HCC)    breast   GERD (gastroesophageal reflux disease)    Hypertension    Leucopenia    Vitamin D deficiency     Objective:  Physical Exam: Vascular: DP/PT pulses 2/4 bilateral. CFT <3 seconds. Normal hair growth on digits. No edema.  Skin. No lacerations or abrasions bilateral feet.  Musculoskeletal: MMT 5/5 bilateral lower extremities in DF, PF, Inversion and Eversion. Deceased ROM in DF of ankle joint.  Non tender to insertion of PT tendon and proximally along course to medial malleolus. No pain with PF and inversion of the foot. Unable to perform single limb heel rise. Pes planus noted with too many toes sign.  Neurological: Sensation intact to light touch.   Assessment:   1. Posterior tibial tendon dysfunction (PTTD) of right lower extremity         Plan:  Patient was evaluated and treated and all questions answered. X-rays reviewed and discussed with patient. Pes planus noted to right foot with collapse of medial arch. No acute fractures or dislocations noted.  Discussed PTTD diagnosis and treatment options with patient. Continue stretching and brace as needed.  Will defer another injection Continue with CMO.   Refilled meloxcam as she does ok if she takes as needed.  Discussed if there is no improvement PT/MRI may be an option. Patient to return to clinic as needed     Louann Sjogren, DPM

## 2023-03-30 ENCOUNTER — Ambulatory Visit
Admission: EM | Admit: 2023-03-30 | Discharge: 2023-03-30 | Disposition: A | Discharge status: Home / Self Care | Payer: Medicare HMO | Attending: Emergency Medicine | Admitting: Emergency Medicine

## 2023-03-30 DIAGNOSIS — N3000 Acute cystitis without hematuria: Secondary | ICD-10-CM

## 2023-03-30 LAB — POCT URINALYSIS DIP (MANUAL ENTRY)
Bilirubin, UA: NEGATIVE
Blood, UA: NEGATIVE
Glucose, UA: NEGATIVE mg/dL
Ketones, POC UA: NEGATIVE mg/dL
Nitrite, UA: NEGATIVE
Protein Ur, POC: NEGATIVE mg/dL
Spec Grav, UA: 1.015 (ref 1.010–1.025)
Urobilinogen, UA: 0.2 E.U./dL
pH, UA: 5.5 (ref 5.0–8.0)

## 2023-03-30 MED ORDER — SULFAMETHOXAZOLE-TRIMETHOPRIM 800-160 MG PO TABS: 1.0000 | ORAL_TABLET | Freq: Two times a day (BID) | ORAL | 0 refills | Status: AC

## 2023-03-30 NOTE — Discharge Instructions (Signed)
Common causes of urinary tract infections include but are not limited to holding your urine longer than you should, squatting instead of sitting down when urinating, sitting around in wet clothing such as a wet swimsuit or gym clothes too long, not emptying your bladder after having sexual intercourse, wiping from back to front instead of front to back after having a bowel movement.     Less common causes of urinary tract infections include but are not limited to anatomical shifts in the location of your bladder or uterus causing obstruction of passage of urine from your bladder to your urethra where your urine comes out or prolapse of your rectum into your vaginal wall.  These less common causes can be evaluated by gynecologist, a urologist or subspecialist called a uro-gynecologist   The urinalysis that we performed in the clinic today was abnormal.  Urine culture will be performed per our protocol.  The result of the urine culture will be available in the next 3 to 5 days and will be posted to your MyChart account.  If there is an abnormal finding, you will be contacted by phone and advised of further treatment recommendations, if any.   You were advised to begin antibiotics today because you are having active symptoms of an acute lower urinary tract infection also known as cystitis.     Please pick up and begin taking your prescription for Bactrim DS (trimethoprim sulfamethoxazole) as soon as possible.  Please take all doses exactly as prescribed.  You can take this medication with or without food.  This medication is safe to take with your other medications.   If you receive a phone call advising you that your urine culture is negative but you begin to feel better after taking antibiotics for 24 hours, please feel free to complete the full course of antibiotics as they were likely needed and the urine culture result was false.  If your culture is negative and you do not feel any better, please return  for repeat evaluation of other possible causes of your symptoms.   Please consider abstaining from sexual intercourse while you are being treated for urinary tract infection.   Thank you for visiting [facility] today.  We appreciate the opportunity to participate in your care.

## 2023-03-30 NOTE — ED Provider Notes (Addendum)
[facility]    CSN: [id number] Arrival date & time: 03/30/23  0946    HISTORY   Chief Complaint  Patient presents with   Abdominal Pain   HPI Sabrina Abbott is a pleasant, 72 y.o. female who presents to urgent care today. Patient complains of "slightly excruciating" post void pain once yesterday which is better today and cloudy urine yesterday which is not cloudy today.  Patient states she recently finished antibiotics for urinary tract infection, EMR reviewed by me, patient was prescribed cefdinir on March 15, 2023 for presumed urinary tract infection, urine culture ultimately revealed 20,000 CFU's of E. coli.  Patient states she has been drinking cranberry juice to "flush" out her bladder. Patient denies abnormal vaginal discharge, vaginal itching, vaginal irritation, perineal pain, rectal pain, pain with defecation, burning during urination, increased frequency of urination, incontinence of urine, flank pain, fever , body aches, chills, rigors, malaise, significant fatigue, nausea, vomiting, diarrhea, and constipation.   Past Medical History:  Diagnosis Date   Allergy 07-09-2013   Seasonal   Blood transfusion without reported diagnosis 12-03-2001   ASCT   Breast cancer (HCC)    Cancer (HCC)    breast   GERD (gastroesophageal reflux disease)    Hypertension    Leucopenia    Vitamin D deficiency    Patient Active Problem List   Diagnosis Date Noted   Posterior tibial tendonitis of right leg 08/30/2022   Nutritional counseling 10/27/2019   Colon cancer screening 03/25/2017   Elevated liver enzymes 03/05/2017   Leucopenia 03/05/2017   Malignant neoplasm of right breast (HCC) 12/22/2014   Cough 09/03/2014   Angioedema 07/16/2014   Restrictive ventilatory defect 07/16/2014   Dermatofibroma of deltoid region 09/25/2012   Hypertension 05/05/2012   GERD (gastroesophageal reflux disease) 05/05/2012   History of breast cancer 05/05/2012   Nail abnormality  05/05/2012   Past Surgical History:  Procedure Laterality Date   BREAST SURGERY  05-24-2001   Modified rt mastectomy   LIMBAL STEM CELL TRANSPLANT     MASTECTOMY Right    MODIFIED RADICAL MASTECTOMY Right    TUBAL LIGATION     WISDOM TOOTH EXTRACTION     OB History   No obstetric history on file.    Home Medications    Prior to Admission medications   Medication Sig Start Date End Date Taking? Authorizing Provider  diclofenac (VOLTAREN) 75 MG EC tablet Take 1 tablet (75 mg total) by mouth 2 (two) times daily. Patient not taking: Reported on 01/30/2023 11/28/22   Louann Sjogren, DPM  sulfamethoxazole-trimethoprim (BACTRIM DS) 800-160 MG tablet Take 1 tablet by mouth 2 (two) times daily for 5 days. 03/30/23 04/04/23 Yes Theadora Rama Scales, PA-C  AMBULATORY NON FORMULARY MEDICATION Please fit patient for a replacement breast prosthesis.  Fax to: [employer] [facility], [address], [address], [place], [place], [address], phone number [phone], fax number [phone] 11/09/21   Christen Butter, NP  amLODipine (NORVASC) 2.5 MG tablet TAKE 1 TABLET EVERY DAY 03/06/23   Christen Butter, NP  chlorthalidone (HYGROTON) 25 MG tablet TAKE 1 TABLET EVERY DAY 02/14/23   Christen Butter, NP  meloxicam (MOBIC) 15 MG tablet TAKE 1 TABLET (15 MG TOTAL) BY MOUTH DAILY. 02/15/23   Louann Sjogren, DPM  RABEprazole (ACIPHEX) 20 MG tablet Take 1 tablet (20 mg total) by mouth daily. 08/16/22   Christen Butter, NP  scopolamine (TRANSDERM-SCOP) 1 MG/3DAYS Place 1 patch (1.5 mg total) onto the skin every 3 (three) days.  06/11/22   Christen Butter, NP    Family History Family History  Problem Relation Age of Onset   Heart disease Father    Alcohol abuse Father    Hyperlipidemia Father    Hypertension Father    Stroke Father    Prostate cancer Brother    Diabetes Brother    Hypertension Brother    Obesity Brother    Arthritis Mother    Varicose Veins Mother    Heart disease Sister    Cancer  Paternal Aunt    Heart disease Sister    Social History Social History   Tobacco Use   Smoking status: Never   Smokeless tobacco: Never  Vaping Use   Vaping status: Never Used  Substance Use Topics   Alcohol use: No   Drug use: No   Allergies   Iodinated contrast media, Lisinopril, Iodine, Meloxicam, and Soy allergy  Review of Systems Review of Systems Pertinent findings revealed after performing a 14 point review of systems has been noted in the history of present illness.  Physical Exam Vital Signs BP 132/78 (BP Location: Left Arm)   Pulse 67   Temp 97.7 F (36.5 C) (Oral)   Resp 17   SpO2 98%   No data found.  Physical Exam Vitals and nursing note reviewed.  Constitutional:      General: She is not in acute distress.    Appearance: Normal appearance. She is not ill-appearing.  HENT:     Head: Normocephalic and atraumatic.  Eyes:     General: Lids are normal.        Right eye: No discharge.        Left eye: No discharge.     Extraocular Movements: Extraocular movements intact.     Conjunctiva/sclera: Conjunctivae normal.     Right eye: Right conjunctiva is not injected.     Left eye: Left conjunctiva is not injected.  Neck:     Trachea: Trachea and phonation normal.  Cardiovascular:     Rate and Rhythm: Normal rate and regular rhythm.     Pulses: Normal pulses.     Heart sounds: Normal heart sounds. No murmur heard.    No friction rub. No gallop.  Pulmonary:     Effort: Pulmonary effort is normal. No accessory muscle usage, prolonged expiration or respiratory distress.     Breath sounds: Normal breath sounds. No stridor, decreased air movement or transmitted upper airway sounds. No decreased breath sounds, wheezing, rhonchi or rales.  Chest:     Chest wall: No tenderness.  Abdominal:     General: Abdomen is flat. Bowel sounds are normal. There is no distension.     Palpations: Abdomen is soft.     Tenderness: There is no abdominal tenderness. There is  no right CVA tenderness or left CVA tenderness.     Hernia: No hernia is present.  Musculoskeletal:        General: Normal range of motion.     Cervical back: Normal range of motion and neck supple. Normal range of motion.  Lymphadenopathy:     Cervical: No cervical adenopathy.  Skin:    General: Skin is warm and dry.     Findings: No erythema or rash.  Neurological:     General: No focal deficit present.     Mental Status: She is alert and oriented to person, place, and time.  Psychiatric:        Mood and Affect: Mood normal.  Behavior: Behavior normal.     Visual Acuity Right Eye Distance:   Left Eye Distance:   Bilateral Distance:    Right Eye Near:   Left Eye Near:    Bilateral Near:     UC Couse / Diagnostics / Procedures:     Radiology No results found.  Procedures Procedures (including critical care time) EKG  Pending results:  Labs Reviewed  POCT URINALYSIS DIP (MANUAL ENTRY) - Abnormal; Notable for the following components:      Result Value   Leukocytes, UA Trace (*)    All other components within normal limits  URINE CULTURE    Medications Ordered in UC: Medications - No data to display  UC Diagnoses / Final Clinical Impressions(s)   I have reviewed the triage vital signs and the nursing notes.  Pertinent labs & imaging results that were available during my care of the patient were reviewed by me and considered in my medical decision making (see chart for details).    Final diagnoses:  Acute recurrent cystitis   Urine dip today revealed trace WBCs.  Urine culture will be performed per our protocol.   Patient was advised to begin Bactrim today due to having active symptoms of urinary tract infection.                    Patient advised that they will be contacted with results and that adjustments to treatment will be provided as indicated based on the results.   Patient was advised of possibility that urine culture results may be negative if  sample provided was obtained late in the day causing urine to be more diluted.  Patient was advised that if antibiotics were effective after the first 24 to 36 hours, despite negative urine culture result, it is recommended that they complete the full course as prescribed.   Patient states she already has a follow-up appointment scheduled with her primary care provider 4 days from now. Return precautions advised.  Please see discharge instructions below for details of plan of care as provided to patient. ED Prescriptions     Medication Sig Dispense Auth. Provider   sulfamethoxazole-trimethoprim (BACTRIM DS) 800-160 MG tablet Take 1 tablet by mouth 2 (two) times daily for 5 days. 10 tablet Theadora Rama Scales, PA-C      PDMP not reviewed this encounter.  Disposition Upon Discharge:  Condition: stable for discharge home  Patient presented with concern for an acute illness with associated systemic symptoms and significant discomfort requiring urgent management. In my opinion, this is a condition that a prudent lay person (someone who possesses an average knowledge of health and medicine) may potentially expect to result in complications if not addressed urgently such as respiratory distress, impairment of bodily function or dysfunction of bodily organs.   As such, the patient has been evaluated and assessed, work-up was performed and treatment was provided in alignment with urgent care protocols and evidence based medicine.  Patient/parent/caregiver has been advised that the patient may require follow up for further testing and/or treatment if the symptoms continue in spite of treatment, as clinically indicated and appropriate.  Routine symptom specific, illness specific and/or disease specific instructions were discussed with the patient and/or caregiver at length.  Prevention strategies for avoiding STD exposure were also discussed.  The patient will follow up with their current PCP if and  as advised. If the patient does not currently have a PCP we will assist them in obtaining one.   The  patient may need specialty follow up if the symptoms continue, in spite of conservative treatment and management, for further workup, evaluation, consultation and treatment as clinically indicated and appropriate.  Patient/parent/caregiver verbalized understanding and agreement of plan as discussed.  All questions were addressed during visit.  Please see discharge instructions below for further details of plan.  Discharge Instructions:   Discharge Instructions      Common causes of urinary tract infections include but are not limited to holding your urine longer than you should, squatting instead of sitting down when urinating, sitting around in wet clothing such as a wet swimsuit or gym clothes too long, not emptying your bladder after having sexual intercourse, wiping from back to front instead of front to back after having a bowel movement.     Less common causes of urinary tract infections include but are not limited to anatomical shifts in the location of your bladder or uterus causing obstruction of passage of urine from your bladder to your urethra where your urine comes out or prolapse of your rectum into your vaginal wall.  These less common causes can be evaluated by gynecologist, a urologist or subspecialist called a uro-gynecologist   The urinalysis that we performed in the clinic today was abnormal.  Urine culture will be performed per our protocol.  The result of the urine culture will be available in the next 3 to 5 days and will be posted to your MyChart account.  If there is an abnormal finding, you will be contacted by phone and advised of further treatment recommendations, if any.   You were advised to begin antibiotics today because you are having active symptoms of an acute lower urinary tract infection also known as cystitis.     Please pick up and begin taking your  prescription for Bactrim DS (trimethoprim sulfamethoxazole) as soon as possible.  Please take all doses exactly as prescribed.  You can take this medication with or without food.  This medication is safe to take with your other medications.   If you receive a phone call advising you that your urine culture is negative but you begin to feel better after taking antibiotics for 24 hours, please feel free to complete the full course of antibiotics as they were likely needed and the urine culture result was false.  If your culture is negative and you do not feel any better, please return for repeat evaluation of other possible causes of your symptoms.   Please consider abstaining from sexual intercourse while you are being treated for urinary tract infection.   Thank you for visiting [facility] today.  We appreciate the opportunity to participate in your care.     This office note has been dictated using Teaching laboratory technician.  Unfortunately, this method of dictation can sometimes lead to typographical or grammatical errors.  I apologize for your inconvenience in advance if this occurs.  Please do not hesitate to reach out to me if clarification is needed.       Theadora Rama Scales, PA-C 03/30/23 1018    Theadora Rama [place], [place] 03/30/23 1018

## 2023-03-30 NOTE — ED Triage Notes (Signed)
Yesterday pt states had severe pain after urinating once yesterday. Report also noted urine was cloudy. Recently finished antibiotics for UTI. Drank cranberry juice

## 2023-04-01 LAB — URINE CULTURE: Culture: >=100000 — AB

## 2023-04-03 ENCOUNTER — Ambulatory Visit (INDEPENDENT_AMBULATORY_CARE_PROVIDER_SITE_OTHER): Payer: Medicare HMO | Admitting: Medical-Surgical

## 2023-04-03 ENCOUNTER — Encounter: Payer: Self-pay | Admitting: Medical-Surgical

## 2023-04-03 VITALS — BP 127/75 | HR 64 | Resp 20 | Ht 68.0 in | Wt 194.9 lb

## 2023-04-03 DIAGNOSIS — C50911 Malignant neoplasm of unspecified site of right female breast: Secondary | ICD-10-CM

## 2023-04-03 DIAGNOSIS — R399 Unspecified symptoms and signs involving the genitourinary system: Secondary | ICD-10-CM

## 2023-04-03 DIAGNOSIS — Z1211 Encounter for screening for malignant neoplasm of colon: Secondary | ICD-10-CM

## 2023-04-03 DIAGNOSIS — K219 Gastro-esophageal reflux disease without esophagitis: Secondary | ICD-10-CM | POA: Diagnosis not present

## 2023-04-03 DIAGNOSIS — N289 Disorder of kidney and ureter, unspecified: Secondary | ICD-10-CM | POA: Diagnosis not present

## 2023-04-03 DIAGNOSIS — I10 Essential (primary) hypertension: Secondary | ICD-10-CM | POA: Diagnosis not present

## 2023-04-03 DIAGNOSIS — E7800 Pure hypercholesterolemia, unspecified: Secondary | ICD-10-CM

## 2023-04-03 MED ORDER — CHLORTHALIDONE 25 MG PO TABS: 25.0000 mg | ORAL_TABLET | Freq: Every day | ORAL | 1 refills | Status: DC

## 2023-04-03 MED ORDER — AMLODIPINE BESYLATE 2.5 MG PO TABS: 2.5000 mg | ORAL_TABLET | Freq: Every day | ORAL | 1 refills | Status: DC

## 2023-04-03 NOTE — Progress Notes (Signed)
        Established patient visit  History, exam, impression, and plan:  1. Primary hypertension Sabrina Abbott 72 year old female presenting today for follow-up on hypertension.  She is currently treated with amlodipine 2.5 mg daily and chlorthalidone 25 mg daily.  Monitoring blood pressure occasionally at home with reports of normal readings.  No medication side effects or concerns.  Denies concerning symptoms.  Cardiopulmonary exam normal.  Checking labs as below.  Blood pressure well-controlled today.  Continue amlodipine and chlorthalidone as prescribed. - CBC - CMP14+EGFR - Lipid panel  2. Gastroesophageal reflux disease without esophagitis She does have a long history of GERD that is currently treated with Aciphex 20 mg but only uses this once daily as needed.  Aware of food triggers and tries to avoid these.  Denies any concerning abdominal symptoms today continue Aciphex as prescribed.  3. Colon cancer screening Discussed colon cancer screening recommendations.  Endorses hemorrhoids that occasionally bleed and is worried that Cologuard will come back positive and a follow-up colonoscopy will be considered diagnostic rather than screening.  Discussed options and she would like a referral to GI to proceed with colonoscopy at this point. - Ambulatory referral to Gastroenterology  4. Malignant neoplasm of right female breast, unspecified estrogen receptor status, unspecified site of breast (HCC) History of right breast cancer in 2002.  She was followed by oncology and given a clean bill of health.  Is no longer following with them but is aware of recommendations for continued mammograms and frequent monitoring.  5. Elevated LDL cholesterol level History of elevated LDL cholesterol not currently treated with medication.  Rechecking lipid panel today. - Lipid panel  6. UTI symptoms Recent issue with recurrent UTI symptoms.  She has been seen twice in the urgent care for this.  Initially she  was treated with [facility] which resolved her symptoms but shortly after, her symptoms returned.  She is now currently completing a course of Bactrim due to a recent urine culture showing E. coli infection.  Notes that her symptoms are improving and she is no longer having bladder spasms.  We discussed the mechanism of UTIs as well as other conditions that can cause similar symptoms.  Also reviewed referral to urology for recurrent urinary tract infections if that becomes necessary.  She will monitor her symptoms and let me know if they recur.   Procedures performed this visit: None.  Return in about 6 months (around 10/01/2023) for chronic disease follow up.  __________________________________ Thayer Ohm, DNP, APRN, FNP-BC Primary Care and Sports Medicine [facility]

## 2023-04-04 LAB — CBC
Hematocrit: 40.1 % (ref 34.0–46.6)
Hemoglobin: 12.4 g/dL (ref 11.1–15.9)
MCH: 27.9 pg (ref 26.6–33.0)
MCHC: 30.9 g/dL — ABNORMAL LOW (ref 31.5–35.7)
MCV: 90 fL (ref 79–97)
Platelets: 195 10*3/uL (ref 150–450)
RBC: 4.45 x10E6/uL (ref 3.77–5.28)
RDW: 13.8 % (ref 11.7–15.4)
WBC: 3.3 10*3/uL — ABNORMAL LOW (ref 3.4–10.8)

## 2023-04-04 LAB — CMP14+EGFR
ALT: 24 IU/L (ref 0–32)
AST: 23 IU/L (ref 0–40)
Albumin: 4.4 g/dL (ref 3.9–4.9)
Alkaline Phosphatase: 97 IU/L (ref 44–121)
BUN/Creatinine Ratio: 18 (ref 12–28)
BUN: 23 mg/dL (ref 8–27)
Bilirubin Total: <0.2 mg/dL (ref 0.0–1.2)
CO2: 23 mmol/L (ref 20–29)
Calcium: 9.7 mg/dL (ref 8.7–10.3)
Chloride: 99 mmol/L (ref 96–106)
Creatinine, Ser: 1.27 mg/dL — ABNORMAL HIGH (ref 0.57–1.00)
Globulin, Total: 3.1 g/dL (ref 1.5–4.5)
Glucose: 67 mg/dL — ABNORMAL LOW (ref 70–99)
Potassium: 4.1 mmol/L (ref 3.5–5.2)
Sodium: 137 mmol/L (ref 134–144)
Total Protein: 7.5 g/dL (ref 6.0–8.5)
eGFR: 45 mL/min/{1.73_m2} — ABNORMAL LOW (ref >59)

## 2023-04-04 LAB — LIPID PANEL
Chol/HDL Ratio: 3.2 ratio (ref 0.0–4.4)
Cholesterol, Total: 192 mg/dL (ref 100–199)
HDL: 60 mg/dL (ref >39)
LDL Chol Calc (NIH): 113 mg/dL — ABNORMAL HIGH (ref 0–99)
Triglycerides: 107 mg/dL (ref 0–149)
VLDL Cholesterol Cal: 19 mg/dL (ref 5–40)

## 2023-04-04 NOTE — Addendum Note (Signed)
Addended byChristen Butter on: 04/04/2023 07:50 AM   Modules accepted: Orders

## 2023-04-23 ENCOUNTER — Encounter: Payer: Self-pay | Admitting: Nurse Practitioner

## 2023-04-29 ENCOUNTER — Encounter: Payer: Self-pay | Admitting: Medical-Surgical

## 2023-04-30 NOTE — Telephone Encounter (Signed)
Documented

## 2023-05-06 ENCOUNTER — Other Ambulatory Visit: Payer: Self-pay | Admitting: Medical-Surgical

## 2023-05-06 DIAGNOSIS — Z1231 Encounter for screening mammogram for malignant neoplasm of breast: Secondary | ICD-10-CM

## 2023-05-09 ENCOUNTER — Encounter: Payer: Self-pay | Admitting: Medical-Surgical

## 2023-05-09 DIAGNOSIS — Z23 Encounter for immunization: Secondary | ICD-10-CM | POA: Diagnosis not present

## 2023-05-27 ENCOUNTER — Ambulatory Visit (INDEPENDENT_AMBULATORY_CARE_PROVIDER_SITE_OTHER): Payer: Medicare HMO | Admitting: Family Medicine

## 2023-05-27 ENCOUNTER — Encounter: Payer: Self-pay | Admitting: Family Medicine

## 2023-05-27 ENCOUNTER — Encounter: Payer: Self-pay | Admitting: Medical-Surgical

## 2023-05-27 VITALS — BP 123/64 | HR 65 | Ht 68.0 in | Wt 196.0 lb

## 2023-05-27 DIAGNOSIS — R399 Unspecified symptoms and signs involving the genitourinary system: Secondary | ICD-10-CM | POA: Diagnosis not present

## 2023-05-27 LAB — POCT URINALYSIS DIP (CLINITEK)
Bilirubin, UA: NEGATIVE
Glucose, UA: NEGATIVE mg/dL
Ketones, POC UA: NEGATIVE mg/dL
Nitrite, UA: NEGATIVE
Spec Grav, UA: 1.015 (ref 1.010–1.025)
Urobilinogen, UA: 0.2 U/dL
pH, UA: 7 (ref 5.0–8.0)

## 2023-05-27 MED ORDER — SULFAMETHOXAZOLE-TRIMETHOPRIM 800-160 MG PO TABS: 1.0000 | ORAL_TABLET | Freq: Two times a day (BID) | ORAL | 0 refills | Status: DC

## 2023-05-27 NOTE — Progress Notes (Signed)
   Acute Office Visit  Subjective:     Patient ID: Sabrina Abbott, female    DOB: 1951/04/20, 72 y.o.   MRN: [id number]  Chief Complaint  Patient presents with   Cystitis    HPI Patient is in today for lower pelvic pain after urination symptoms started 4 days ago.  She has had them previously.  She does not follow with urology.no hematuria, no fever or back pain.    He recently had an E. coli urinary tract infection at the end of September culture positive.   ROS      Objective:    BP 123/64   Pulse 65   Ht 5\' 8"  (1.727 m)   Wt 196 lb (88.9 kg)   SpO2 100%   BMI 29.80 kg/m    Physical Exam Vitals reviewed.  Constitutional:      Appearance: Normal appearance.  HENT:     Head: Normocephalic.  Pulmonary:     Effort: Pulmonary effort is normal.  Neurological:     Mental Status: She is alert and oriented to person, place, and time.  Psychiatric:        Mood and Affect: Mood normal.        Behavior: Behavior normal.     Results for orders placed or performed in visit on 05/27/23  POCT URINALYSIS DIP (CLINITEK)  Result Value Ref Range   Color, UA yellow yellow   Clarity, UA cloudy (A) clear   Glucose, UA negative negative mg/dL   Bilirubin, UA negative negative   Ketones, POC UA negative negative mg/dL   Spec Grav, UA 8.657 8.469 - 1.025   Blood, UA small (A) negative   pH, UA 7.0 5.0 - 8.0   POC PROTEIN,UA trace negative, trace   Urobilinogen, UA 0.2 0.2 or 1.0 E.U./dL   Nitrite, UA Negative Negative   Leukocytes, UA Large (3+) (A) Negative        Assessment & Plan:   Problem List Items Addressed This Visit   None Visit Diagnoses     UTI symptoms    -  Primary   Relevant Orders   POCT URINALYSIS DIP (CLINITEK) (Completed)      Will go ahead and treat for UTI with Bactrim DS.  Will send culture and call with results once available.  We did discuss potential options for prophylaxis she is really only started getting UTIs after menopause.  She  would be a great candidate for topical estrogen even though she does have a prior history of breast cancer.  Or consider prophylaxis with antibiotics for 4 to 6 months as an option as well. This is her 3rd UTI this year.   Meds ordered this encounter  Medications   sulfamethoxazole-trimethoprim (BACTRIM DS) 800-160 MG tablet    Sig: Take 1 tablet by mouth 2 (two) times daily.    Dispense:  6 tablet    Refill:  0    No follow-ups on file.  Nani Gasser, MD

## 2023-05-27 NOTE — Addendum Note (Signed)
Addended by: Deno Etienne on: 05/27/2023 12:07 PM   Modules accepted: Orders

## 2023-05-30 ENCOUNTER — Encounter: Payer: Self-pay | Admitting: Family Medicine

## 2023-05-30 LAB — URINE CULTURE

## 2023-05-30 NOTE — Progress Notes (Signed)
Hi Sabrina Abbott,   The urine culture actually came back negative for any significant bacteria I know we went ahead and put you on an antibiotic while you are here are you feeling any better if not then we may need to do further workup for your pain and discomfort.

## 2023-05-31 NOTE — Progress Notes (Unsigned)
05/31/2023 Sabrina Abbott [id number] 1951/04/08   CHIEF COMPLAINT: Discuss scheduling a colonoscopy  HISTORY OF PRESENT ILLNESS: Sabrina Abbott is a 72 year old female with a past medical history of hypertension, breast cancer s/p mastectomy, chemotherapy, radiation and stem cell transplant 2003 and GERD.  She presents today to discuss scheduling a colonoscopy.  Recent UTI treated with Bactrim.  Colonoscopy 2011 - normal, repeat 10 years.  Negative Cologuard 03/15/2015 and 03/24/2020     Latest Ref Rng & Units 04/03/2023    9:38 AM 04/02/2022   10:06 AM 03/30/2021   12:00 AM  CBC  WBC 3.4 - 10.8 x10E3/uL 3.3  3.8  3.1   Hemoglobin 11.1 - 15.9 g/dL 40.9  81.1  91.4   Hematocrit 34.0 - 46.6 % 40.1  36.8  37.5   Platelets 150 - 450 x10E3/uL 195  195  190         Latest Ref Rng & Units 04/03/2023    9:38 AM 08/30/2022    9:54 AM 04/02/2022   10:06 AM  CMP  Glucose 70 - 99 mg/dL 67  85  71   BUN 8 - 27 mg/dL 23  21  16    Creatinine 0.57 - 1.00 mg/dL 7.82  9.56  2.13   Sodium 134 - 144 mmol/L 137  140  140   Potassium 3.5 - 5.2 mmol/L 4.1  4.0  4.7   Chloride 96 - 106 mmol/L 99  100  104   CO2 20 - 29 mmol/L 23  30  29    Calcium 8.7 - 10.3 mg/dL 9.7  9.7  9.5   Total Protein 6.0 - 8.5 g/dL 7.5   7.1   Total Bilirubin 0.0 - 1.2 mg/dL <0.8   0.3   Alkaline Phos 44 - 121 IU/L 97     AST 0 - 40 IU/L 23   19   ALT 0 - 32 IU/L 24   16      Past Medical History:  Diagnosis Date   Allergy 07-09-2013   Seasonal   Blood transfusion without reported diagnosis 12-03-2001   ASCT   Breast cancer (HCC)    Cancer (HCC)    breast   GERD (gastroesophageal reflux disease)    History of breast cancer 05/05/2012   Overview:   Stage IIIA right breast.  T2N2 with 9/12 positive nodes - s/p mastectomy, chemotherapy, radiation, and stem cell transplant completed in 2003.     Hypertension    Leucopenia    Vitamin D deficiency    Past Surgical History:  Procedure Laterality Date    BREAST SURGERY  05-24-2001   Modified rt mastectomy   LIMBAL STEM CELL TRANSPLANT     MASTECTOMY Right    MODIFIED RADICAL MASTECTOMY Right    TUBAL LIGATION     WISDOM TOOTH EXTRACTION     Social History:   Family History: Father with history of heart disease and CVA.  Brother with history of prostate cancer.  Brother with obesity.   reports that she has never smoked. She has never used smokeless tobacco. She reports that she does not drink alcohol and does not use drugs.   Allergies  Allergen Reactions   Iodinated Contrast Media Other (See Comments)    Made one side of her "right side of body hurt real bad"    Lisinopril Swelling   Iodine    Soy Allergy       Outpatient Encounter Medications as of 06/03/2023  Medication  Sig   AMBULATORY NON FORMULARY MEDICATION Please fit patient for a replacement breast prosthesis.  Fax to: [employer], [address], [place], [place], [address], phone number [phone], fax number [phone]   amLODipine (NORVASC) 2.5 MG tablet Take 1 tablet (2.5 mg total) by mouth daily.   chlorthalidone (HYGROTON) 25 MG tablet Take 1 tablet (25 mg total) by mouth daily.   RABEprazole (ACIPHEX) 20 MG tablet Take 1 tablet (20 mg total) by mouth daily.   sulfamethoxazole-trimethoprim (BACTRIM DS) 800-160 MG tablet Take 1 tablet by mouth 2 (two) times daily.   No facility-administered encounter medications on file as of 06/03/2023.    REVIEW OF SYSTEMS:  Gen: Denies fever, sweats or chills. No weight loss.  CV: Denies chest pain, palpitations or edema. Resp: Denies cough, shortness of breath of hemoptysis.  GI: Denies heartburn, dysphagia, stomach or lower abdominal pain. No diarrhea or constipation.  GU: Denies urinary burning, blood in urine, increased urinary frequency or incontinence. MS: Denies joint pain, muscles aches or weakness. Derm: Denies rash, itchiness, skin lesions or unhealing  ulcers. Psych: Denies depression, anxiety, memory loss or confusion. Heme: Denies bruising, easy bleeding. Neuro:  Denies headaches, dizziness or paresthesias. Endo:  Denies any problems with DM, thyroid or adrenal function.  PHYSICAL EXAM: There were no vitals taken for this visit. General: in no acute distress. Head: Normocephalic and atraumatic. Eyes:  Sclerae non-icteric, conjunctive pink. Ears: Normal auditory acuity. Mouth: Dentition intact. No ulcers or lesions.  Neck: Supple, no lymphadenopathy or thyromegaly.  Lungs: Clear bilaterally to auscultation without wheezes, crackles or rhonchi. Heart: Regular rate and rhythm. No murmur, rub or gallop appreciated.  Abdomen: Soft, nontender, nondistended. No masses. No hepatosplenomegaly. Normoactive bowel sounds x 4 quadrants.  Rectal: Deferred.  Musculoskeletal: Symmetrical with no gross deformities. Skin: Warm and dry. No rash or lesions on visible extremities. Extremities: No edema. Neurological: Alert oriented x 4, no focal deficits.  Psychological:  Alert and cooperative. Normal mood and affect.  ASSESSMENT AND PLAN:    CC:  Christen Butter, NP

## 2023-06-03 ENCOUNTER — Encounter: Payer: Self-pay | Admitting: Nurse Practitioner

## 2023-06-03 ENCOUNTER — Ambulatory Visit: Payer: Medicare HMO | Admitting: Nurse Practitioner

## 2023-06-03 VITALS — BP 120/80 | HR 71 | Ht 68.0 in | Wt 197.1 lb

## 2023-06-03 DIAGNOSIS — K625 Hemorrhage of anus and rectum: Secondary | ICD-10-CM

## 2023-06-03 MED ORDER — NA SULFATE-K SULFATE-MG SULF 17.5-3.13-1.6 GM/177ML PO SOLN: 1.0000 | Freq: Once | ORAL | 0 refills | Status: AC

## 2023-06-03 NOTE — Progress Notes (Signed)
Agree with assessment / plan as outlined.  

## 2023-06-03 NOTE — Patient Instructions (Addendum)
Miralax- every night as needed  Benefiber- take 1 tablespoon daily  Due to recent changes in healthcare laws, you may see the results of your imaging and laboratory studies on MyChart before your provider has had a chance to review them.  We understand that in some cases there may be results that are confusing or concerning to you. Not all laboratory results come back in the same time frame and the provider may be waiting for multiple results in order to interpret others.  Please give [place] 48 hours in order for your provider to thoroughly review all the results before contacting the office for clarification of your results.   Thank you for trusting me with your gastrointestinal care!   Alcide Evener, CRNP

## 2023-06-19 ENCOUNTER — Other Ambulatory Visit: Payer: Self-pay | Admitting: Medical-Surgical

## 2023-06-19 ENCOUNTER — Ambulatory Visit (INDEPENDENT_AMBULATORY_CARE_PROVIDER_SITE_OTHER): Payer: Medicare HMO

## 2023-06-19 DIAGNOSIS — Z1231 Encounter for screening mammogram for malignant neoplasm of breast: Secondary | ICD-10-CM | POA: Diagnosis not present

## 2023-06-26 ENCOUNTER — Encounter: Payer: Self-pay | Admitting: Gastroenterology

## 2023-06-26 ENCOUNTER — Ambulatory Visit: Payer: Medicare HMO | Admitting: Gastroenterology

## 2023-06-26 VITALS — BP 127/65 | HR 66 | Temp 97.3°F | Resp 13 | Ht 68.0 in | Wt 197.0 lb

## 2023-06-26 DIAGNOSIS — K648 Other hemorrhoids: Secondary | ICD-10-CM

## 2023-06-26 DIAGNOSIS — D175 Benign lipomatous neoplasm of intra-abdominal organs: Secondary | ICD-10-CM

## 2023-06-26 DIAGNOSIS — D125 Benign neoplasm of sigmoid colon: Secondary | ICD-10-CM

## 2023-06-26 DIAGNOSIS — D122 Benign neoplasm of ascending colon: Secondary | ICD-10-CM

## 2023-06-26 DIAGNOSIS — K562 Volvulus: Secondary | ICD-10-CM

## 2023-06-26 DIAGNOSIS — Q439 Congenital malformation of intestine, unspecified: Secondary | ICD-10-CM | POA: Diagnosis not present

## 2023-06-26 DIAGNOSIS — K635 Polyp of colon: Secondary | ICD-10-CM | POA: Diagnosis not present

## 2023-06-26 DIAGNOSIS — D120 Benign neoplasm of cecum: Secondary | ICD-10-CM

## 2023-06-26 DIAGNOSIS — K625 Hemorrhage of anus and rectum: Secondary | ICD-10-CM | POA: Diagnosis not present

## 2023-06-26 MED ORDER — SODIUM CHLORIDE 0.9 % IV SOLN
500.0000 mL | Freq: Once | INTRAVENOUS | Status: DC
Start: 2023-06-26 — End: 2023-06-26

## 2023-06-26 NOTE — Progress Notes (Signed)
Report given to PACU, vss 

## 2023-06-26 NOTE — Patient Instructions (Addendum)
- Resume previous diet.                           - Continue present medications.                           - Await pathology results. 7 polyps removed ans sent to pathology.                           -Handouts on findings given to patient. (Polyps and hemorrhoids)   YOU HAD AN ENDOSCOPIC PROCEDURE TODAY AT THE [facility]:   Refer to the procedure report that was given to you for any specific questions about what was found during the examination.  If the procedure report does not answer your questions, please call your gastroenterologist to clarify.  If you requested that your care partner not be given the details of your procedure findings, then the procedure report has been included in a sealed envelope for you to review at your convenience later.  YOU SHOULD EXPECT: Some feelings of bloating in the abdomen. Passage of more gas than usual.  Walking can help get rid of the air that was put into your GI tract during the procedure and reduce the bloating. If you had a lower endoscopy (such as a colonoscopy or flexible sigmoidoscopy) you may notice spotting of blood in your stool or on the toilet paper. If you underwent a bowel prep for your procedure, you may not have a normal bowel movement for a few days.  Please Note:  You might notice some irritation and congestion in your nose or some drainage.  This is from the oxygen used during your procedure.  There is no need for concern and it should clear up in a day or so.  SYMPTOMS TO REPORT IMMEDIATELY:  Following lower endoscopy (colonoscopy or flexible sigmoidoscopy):  Excessive amounts of blood in the stool  Significant tenderness or worsening of abdominal pains  Swelling of the abdomen that is new, acute  Fever of 100F or higher  For urgent or emergent issues, a gastroenterologist can be reached at any hour by calling [phone]. Do not use MyChart messaging for urgent concerns.    DIET:  We do  recommend a small meal at first, but then you may proceed to your regular diet.  Drink plenty of fluids but you should avoid alcoholic beverages for 24 hours.  ACTIVITY:  You should plan to take it easy for the rest of today and you should NOT DRIVE or use heavy machinery until tomorrow (because of the sedation medicines used during the test).    FOLLOW UP: Our staff will call the number listed on your records the next business day following your procedure.  We will call around 7:15- 8:00 am to check on you and address any questions or concerns that you may have regarding the information given to you following your procedure. If we do not reach you, we will leave a message.     If any biopsies were taken you will be contacted by phone or by letter within the next 1-3 weeks.  Please call us at [phone] if you have not heard about the biopsies in 3 weeks.    SIGNATURES/CONFIDENTIALITY: You and/or your care partner have signed paperwork which will be entered into your electronic medical record.  These signatures attest to the  fact that that the information above on your After Visit Summary has been reviewed and is understood.  Full responsibility of the confidentiality of this discharge information lies with you and/or your care-partner.

## 2023-06-26 NOTE — Progress Notes (Signed)
History and Physical Interval Note: Patient see on 06/03/23 - no interval changes. Colonoscopy done to evaluate rectal bleeding. History of normal colonoscopy in 2011, she has had negative Cologuard testing in 2016 and 2019, but then had recent episode of rectal bleeding. No significant bleeding since we have seen her in the office. No other changes in her bowels. She wishes to proceed after discussion of risks / benefits.    06/26/2023 3:26 PM  Shemeika B Engebretsen  has presented today for endoscopic procedure(s), with the diagnosis of  Encounter Diagnosis  Name Primary?   Rectal bleeding Yes  .  The various methods of evaluation and treatment have been discussed with the patient and/or family. After consideration of risks, benefits and other options for treatment, the patient has consented to  the endoscopic procedure(s).   The patient's history has been reviewed, patient examined, no change in status, stable for surgery.  I have reviewed the patient's chart and labs.  Questions were answered to the patient's satisfaction.    Harlin Rain, MD [facility]

## 2023-06-26 NOTE — Op Note (Signed)
[facility] Patient Name: Sabrina Abbott Procedure Date: 06/26/2023 3:29 PM MRN: [id number] Endoscopist: Viviann Spare P. Adela Lank , MD, [id number] Age: 72 Referring MD:  Date of Birth: 12-11-1950 Gender: Female Account #: [id number] Procedure:                Colonoscopy Indications:              Rectal bleeding Medicines:                Monitored Anesthesia Care Procedure:                Pre-Anesthesia Assessment:                           - Prior to the procedure, a History and Physical                            was performed, and patient medications and                            allergies were reviewed. The patient's tolerance of                            previous anesthesia was also reviewed. The risks                            and benefits of the procedure and the sedation                            options and risks were discussed with the patient.                            All questions were answered, and informed consent                            was obtained. Prior Anticoagulants: The patient has                            taken no anticoagulant or antiplatelet agents. ASA                            Grade Assessment: II - A patient with mild systemic                            disease. After reviewing the risks and benefits,                            the patient was deemed in satisfactory condition to                            undergo the procedure.                           After obtaining informed consent, the colonoscope  was passed under direct vision. Throughout the                            procedure, the patient's blood pressure, pulse, and                            oxygen saturations were monitored continuously. The                            [id number]-HQ190L Colonoscope [id number] was introduced                            through the anus and advanced to the the cecum,                            identified by appendiceal orifice and  ileocecal                            valve. The colonoscopy was technically difficult                            and complex due to significant looping. The patient                            tolerated the procedure well. The quality of the                            bowel preparation was adequate. The ileocecal                            valve, appendiceal orifice, and rectum were                            photographed. Scope In: 3:33:55 PM Scope Out: 4:10:38 PM Scope Withdrawal Time: 0 hours 20 minutes 34 seconds  Total Procedure Duration: 0 hours 36 minutes 43 seconds  Findings:                 The perianal and digital rectal examinations were                            normal.                           Two flat polyps were found in the cecum. The polyps                            were 2 to 4 mm in size. These polyps were removed                            with a cold snare. Resection and retrieval were                            complete.  There was a lipoma, in the ascending colon.                           Four flat polyps were found in the ascending colon.                            The polyps were 2 to 5 mm in size. These polyps                            were removed with a cold snare. Resection was                            complete, but the polyp tissue was only partially                            retrieved (3 of 4 removed).                           A 3 mm polyp was found in the sigmoid colon. The                            polyp was sessile. The polyp was removed with a                            cold snare. Resection and retrieval were complete.                           The colon extremely tortuous, long and redundant                            with looping - pediatric colonoscope used initially                            but due to looping in right colon cecal intubation                            was not successful with that. It was withdrawn  and                            replaced with regular adult colonoscope which                            worked much better to achieve cecal intubation.                           Copious quantities of liquid stool was found in the                            entire colon, making visualization difficult.                            EXTENSIVE lavage of the colon was performed using  copious amounts of sterile water, resulting in                            clearance with adequate visualization.                           Internal hemorrhoids were found.                           The exam was otherwise without abnormality. Complications:            No immediate complications. Estimated blood loss:                            Minimal. Estimated Blood Loss:     Estimated blood loss was minimal. Impression:               - Two 2 to 4 mm polyps in the cecum, removed with a                            cold snare. Resected and retrieved.                           - Lipoma in the ascending colon.                           - Four 2 to 5 mm polyps in the ascending colon,                            removed with a cold snare. Complete resection. 3 of                            the polyps retrieved, one was not.                           - One 3 mm polyp in the sigmoid colon, removed with                            a cold snare. Resected and retrieved.                           - Tortuous colon with significant looping                           - Stool in the entire examined colon leading to                            extensive lavage.                           - Internal hemorrhoids.                           - The examination was otherwise normal.  Internal hemorrhoids are the cause of prior scant                            bleeding symptoms. Patient denies any significant                            or regular bleeding symptoms. Recommend daily fiber                             supplement and avoid straining. If bleeding recurs                            moving forward with increased frequency,                            consideration for hemorrhoid banding but I do not                            think that is needed at this time. Recommendation:           - Patient has a contact number available for                            emergencies. The signs and symptoms of potential                            delayed complications were discussed with the                            patient. Return to normal activities tomorrow.                            Written discharge instructions were provided to the                            patient.                           - Resume previous diet.                           - Continue present medications.                           - Await pathology results. Viviann Spare P. Reegan Mctighe, MD 06/26/2023 4:22:51 PM This report has been signed electronically.

## 2023-06-26 NOTE — Progress Notes (Signed)
Called to room to assist during endoscopic procedure.  Patient ID and intended procedure confirmed with present staff. Received instructions for my participation in the procedure from the performing physician.  

## 2023-06-26 NOTE — Progress Notes (Signed)
Pt's states no medical or surgical changes since previsit or office visit. 

## 2023-06-27 ENCOUNTER — Telehealth: Payer: Self-pay | Admitting: *Deleted

## 2023-06-27 MED ORDER — SULFAMETHOXAZOLE-TRIMETHOPRIM 800-160 MG PO TABS: 1.0000 | ORAL_TABLET | Freq: Two times a day (BID) | ORAL | 0 refills | Status: AC

## 2023-06-27 NOTE — Telephone Encounter (Signed)
  Follow up Call-     06/26/2023    3:11 PM  Call back number  Post procedure Call Back phone  # [phone]  Permission to leave phone message Yes     Patient questions:  Do you have a fever, pain , or abdominal swelling? No. Pain Score  0 *  Have you tolerated food without any problems? Yes.    Have you been able to return to your normal activities? Yes.    Do you have any questions about your discharge instructions: Diet   No. Medications  No. Follow up visit  No.  Do you have questions or concerns about your Care? Yes-pt states that during bowel prep she had some leaking of stool overnight that did get in her vaginal area. She is concerned about a possible UTI, as she has had frequent UTI's recently r/t perimenopause. She is asking if MD can write a prescription for Bactrim for her to have if she were to develop a UTI. States she will only take it if she becomes symptomatic. Will send request to MD and RN will communicate his response to the pt.   Actions: * If pain score is 4 or above: No action needed, pain <4.

## 2023-06-27 NOTE — Telephone Encounter (Signed)
If she has a history of UTIs in the past and concern about possibility for recurrence given issues with prep can give script for: Trimethoprim sulfa - one double-strength tablet (160 mg/800 mg) orally twice daily for 3 days She should only take this if she becomes symptomatic. Thanks

## 2023-06-27 NOTE — Addendum Note (Signed)
Addended by: Mady Haagensen on: 06/27/2023 08:38 AM   Modules accepted: Orders

## 2023-06-27 NOTE — Telephone Encounter (Signed)
Prescription sent to pt's pharmacy. Called and communicated MD recommendations to pt. She verbalizes understanding.

## 2023-07-01 LAB — SURGICAL PATHOLOGY

## 2023-07-12 IMAGING — MG DIGITAL SCREENING UNILAT LEFT W/ TOMO W/ CAD
4 series · 4 of 12 positions shown · non-contrast
Comparison: Previous exam(s).

CLINICAL DATA: Screening.

EXAM:
DIGITAL SCREENING UNILATERAL LEFT MAMMOGRAM WITH CAD AND
TOMOSYNTHESIS
TECHNIQUE: Left screening digital craniocaudal and mediolateral oblique
mammograms were obtained. Left screening digital breast
tomosynthesis was performed. The images were evaluated with
computer-aided detection.

[L CC synth-2D]
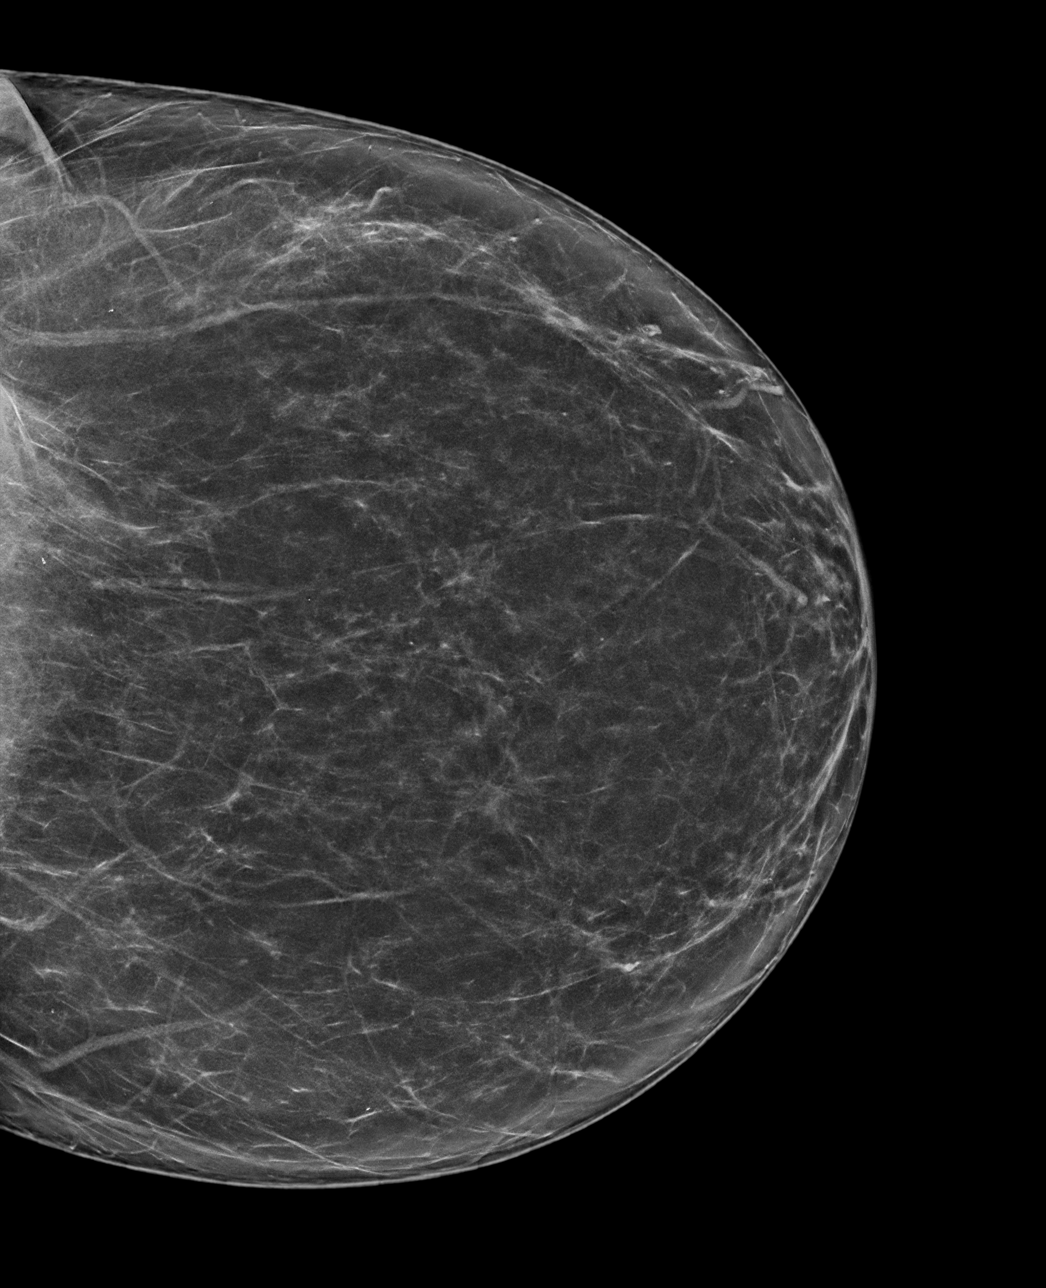

[L MLO synth-2D]
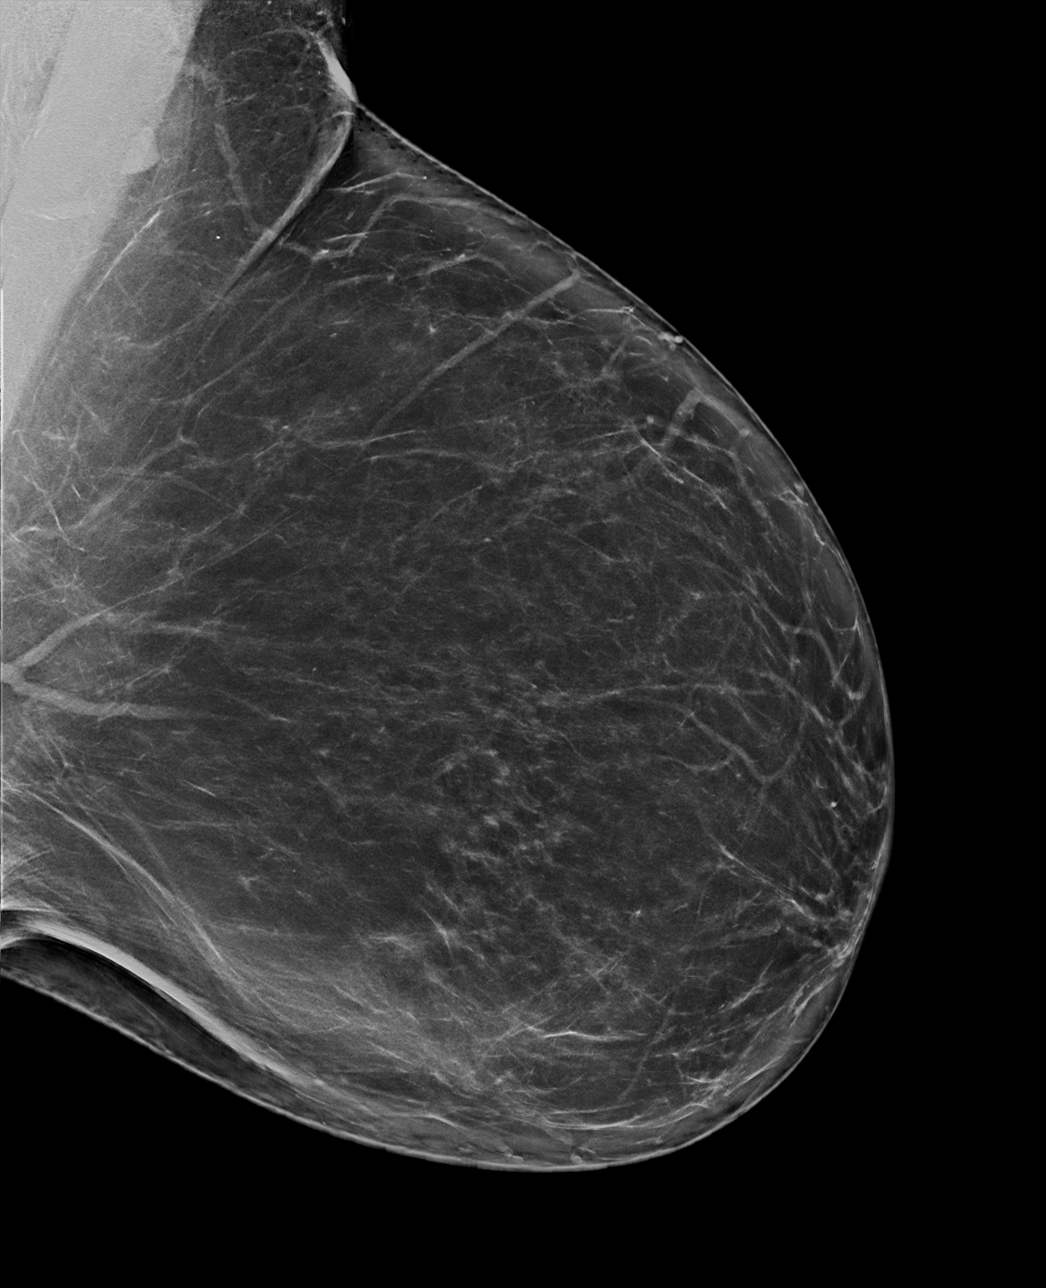

[L CC tomo · tomo slice 41/80.0]
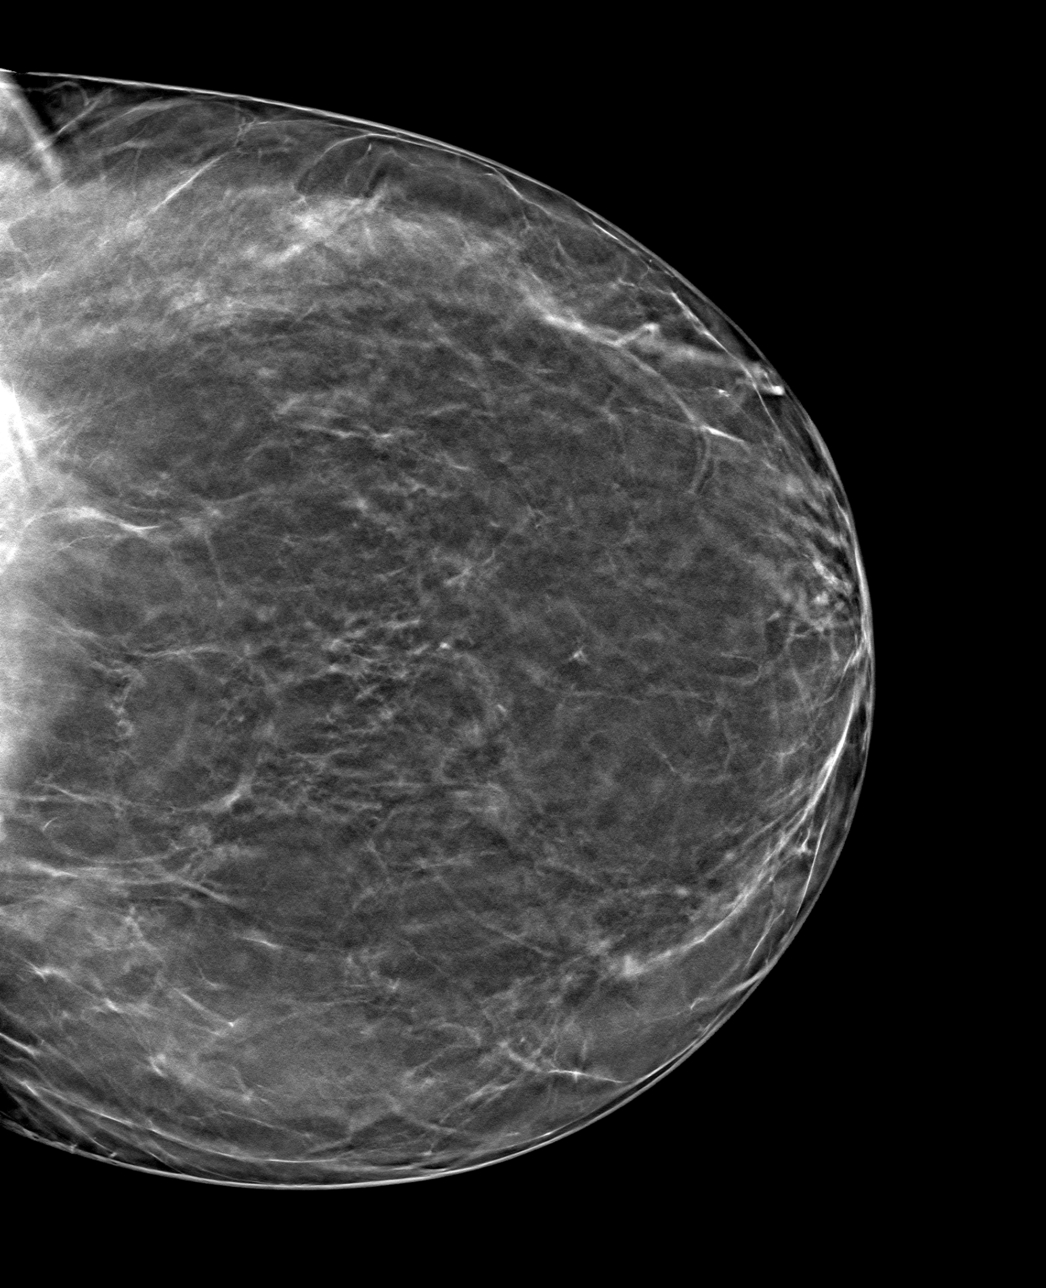

[L MLO tomo · tomo slice 48/95.0]
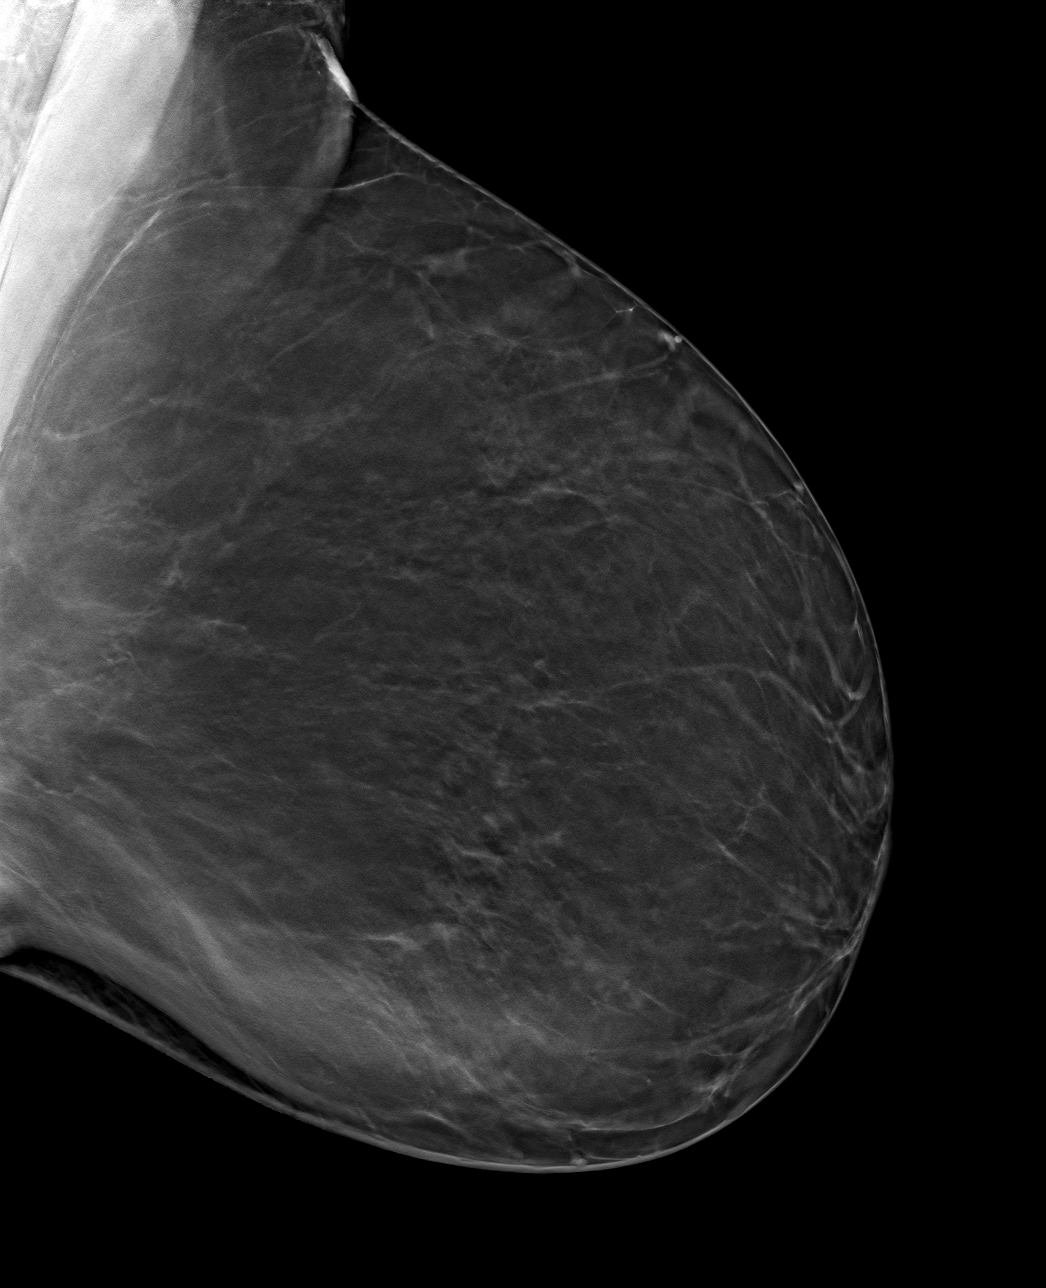

[4 of 12 positions shown; findings below may reference images not displayed]

ACR Breast Density Category b: There are scattered areas of
fibroglandular density.
FINDINGS: The patient has had a right mastectomy. There are no findings
suspicious for malignancy.
IMPRESSION: No mammographic evidence of malignancy. A result letter of this
screening mammogram will be mailed directly to the patient.

RECOMMENDATION:
Screening mammogram in one year.  (Code:QW-[id number])

BI-RADS CATEGORY  1: Negative.

## 2023-08-14 ENCOUNTER — Encounter: Payer: Self-pay | Admitting: Medical-Surgical

## 2023-08-14 MED ORDER — AMLODIPINE BESYLATE 2.5 MG PO TABS: 2.5000 mg | ORAL_TABLET | Freq: Every day | ORAL | 1 refills | Status: DC

## 2023-08-14 MED ORDER — CHLORTHALIDONE 25 MG PO TABS: 25.0000 mg | ORAL_TABLET | Freq: Every day | ORAL | 1 refills | Status: DC

## 2023-09-18 ENCOUNTER — Ambulatory Visit: Payer: Medicare HMO

## 2023-09-18 VITALS — Ht 68.0 in | Wt 195.0 lb

## 2023-09-18 DIAGNOSIS — Z0000 Encounter for general adult medical examination without abnormal findings: Secondary | ICD-10-CM

## 2023-09-18 NOTE — Patient Instructions (Signed)
  Sabrina Abbott , Thank you for taking time to come for your Medicare Wellness Visit. I appreciate your ongoing commitment to your health goals. Please review the following plan we discussed and let me know if I can assist you in the future.   These are the goals we discussed:  Goals       Activity and Exercise Increased      She would like to be more active and drink more water.       DIET - INCREASE WATER INTAKE (pt-stated)      Patient Stated (pt-stated)      Patient stated that she would like to continue to maintain her weight.        This is a list of the screening recommended for you and due dates:  Health Maintenance  Topic Date Due   DTaP/Tdap/Td vaccine (2 - Td or Tdap) 07/10/2015   COVID-19 Vaccine (7 - Pfizer risk 2024-25 season) 11/06/2023   Medicare Annual Wellness Visit  09/17/2024   Mammogram  06/18/2025   Colon Cancer Screening  06/25/2026   Pneumonia Vaccine  Completed   Flu Shot  Completed   DEXA scan (bone density measurement)  Completed   Hepatitis C Screening  Completed   HPV Vaccine  Aged Out   Cologuard (Stool DNA test)  Discontinued   Zoster (Shingles) Vaccine  Discontinued

## 2023-09-18 NOTE — Progress Notes (Signed)
 Subjective:   Sabrina Abbott is a 73 y.o. female who presents for Medicare Annual (Subsequent) preventive examination.  Visit Complete: Virtual I connected with  Larina Earthly on 09/18/23 by a audio enabled telemedicine application and verified that I am speaking with the correct person using two identifiers.  Patient Location: Home  Provider Location: Office/Clinic  I discussed the limitations of evaluation and management by telemedicine. The patient expressed understanding and agreed to proceed.  Vital Signs: Because this visit was a virtual/telehealth visit, some criteria may be missing or patient reported. Any vitals not documented were not able to be obtained and vitals that have been documented are patient reported.  Patient Medicare AWV questionnaire was completed by the patient on 09/14/2023; I have confirmed that all information answered by patient is correct and no changes since this date.  Cardiac Risk Factors include: advanced age (>47men, >1 women);hypertension;family history of premature cardiovascular disease     Objective:    Today's Vitals   09/18/23 0753  Weight: 195 lb (88.5 kg)  Height: 5\' 8"  (1.727 m)   Body mass index is 29.65 kg/m.     09/18/2023    8:00 AM 01/30/2023   10:12 AM 09/10/2022    9:04 AM 09/04/2021    8:26 AM 10/13/2019    3:10 PM  Advanced Directives  Does Patient Have a Medical Advance Directive? Yes Yes Yes Yes No  Type of Advance Directive Living will Healthcare Power of [place];Living will Living will Healthcare Power of Attorney   Does patient want to make changes to medical advance directive? No - Patient declined No - Patient declined No - Patient declined No - Patient declined   Copy of Healthcare Power of Attorney in Chart?  No - copy requested  No - copy requested   Would patient like information on creating a medical advance directive?     Yes (MAU/Ambulatory/Procedural Areas - Information given)    Current Medications  (verified) Outpatient Encounter Medications as of 09/18/2023  Medication Sig   AMBULATORY NON FORMULARY MEDICATION Please fit patient for a replacement breast prosthesis.  Fax to: [employer], [address], [place], [place], [address], phone number [phone], fax number [phone]   amLODipine (NORVASC) 2.5 MG tablet Take 1 tablet (2.5 mg total) by mouth daily.   chlorthalidone (HYGROTON) 25 MG tablet Take 1 tablet (25 mg total) by mouth daily.   RABEprazole (ACIPHEX) 20 MG tablet Take 1 tablet (20 mg total) by mouth daily.   No facility-administered encounter medications on file as of 09/18/2023.    Allergies (verified) Iodinated contrast media, Lisinopril, Iodine, and Soy allergy (obsolete)   History: Past Medical History:  Diagnosis Date   Allergy 07-09-2013   Seasonal   Blood transfusion without reported diagnosis 12-03-2001   ASCT   Breast cancer (HCC)    Cancer (HCC)    breast   GERD (gastroesophageal reflux disease)    History of breast cancer 05/05/2012   Overview:   Stage IIIA right breast.  T2N2 with 9/12 positive nodes - s/p mastectomy, chemotherapy, radiation, and stem cell transplant completed in 2003.     Hypertension    Leucopenia    Vitamin D deficiency    Past Surgical History:  Procedure Laterality Date   BREAST SURGERY  05-24-2001   Modified rt mastectomy   LIMBAL STEM CELL TRANSPLANT     MASTECTOMY Right    MODIFIED RADICAL MASTECTOMY Right    TUBAL LIGATION  WISDOM TOOTH EXTRACTION     Family History  Problem Relation Age of Onset   Heart disease Father    Alcohol abuse Father    Hyperlipidemia Father    Hypertension Father    Stroke Father    Prostate cancer Brother    Diabetes Brother    Hypertension Brother    Obesity Brother    Arthritis Mother    Varicose Veins Mother    Heart disease Sister    Cancer Paternal Aunt    Heart disease Sister    Social History   Socioeconomic  History   Marital status: Married    Spouse name: Windy Fast   Number of children: 2   Years of education: 16   Highest education level: Bachelor's degree (e.g., BA, AB, BS)  Occupational History   Occupation: Retired  Tobacco Use   Smoking status: Never   Smokeless tobacco: Never  Vaping Use   Vaping status: Never Used  Substance and Sexual Activity   Alcohol use: No   Drug use: No   Sexual activity: Not Currently    Partners: Male    Birth control/protection: Post-menopausal  Other Topics Concern   Not on file  Social History Narrative   Lives with her youngest daughter. She enjoys baking and socializing with her friends.   Social Drivers of Corporate investment banker Strain: Low Risk  (09/18/2023)   Overall Financial Resource Strain (CARDIA)    Difficulty of Paying Living Expenses: Not hard at all  Food Insecurity: No Food Insecurity (09/18/2023)   Hunger Vital Sign    Worried About Running Out of Food in the Last Year: Never true    Ran Out of Food in the Last Year: Never true  Transportation Needs: No Transportation Needs (09/18/2023)   PRAPARE - Administrator, Civil Service (Medical): No    Lack of Transportation (Non-Medical): No  Physical Activity: Sufficiently Active (09/18/2023)   Exercise Vital Sign    Days of Exercise per Week: 7 days    Minutes of Exercise per Session: 30 min  Stress: No Stress Concern Present (09/18/2023)   [employer] of Occupational Health - Occupational Stress Questionnaire    Feeling of Stress : Not at all  Social Connections: Moderately Integrated (09/18/2023)   Social Connection and Isolation Panel [NHANES]    Frequency of Communication with Friends and Family: More than three times a week    Frequency of Social Gatherings with Friends and Family: More than three times a week    Attends Religious Services: More than 4 times per year    Active Member of [employer] or Organizations: Yes    Attends Engineer, structural:  More than 4 times per year    Marital Status: Separated    Tobacco Counseling Counseling given: Not Answered   Clinical Intake:  Pre-visit preparation completed: Yes  Pain : No/denies pain     BMI - recorded: 29.65 Nutritional Risks: None Diabetes: No  How often do you need to have someone help you when you read instructions, pamphlets, or other written materials from your doctor or pharmacy?: 1 - Never What is the last grade level you completed in school?: 16  Interpreter Needed?: No      Activities of Daily Living    09/18/2023    7:54 AM 09/14/2023    3:45 PM  In your present state of health, do you have any difficulty performing the following activities:  Hearing? 0 0  Vision?  0 0  Difficulty concentrating or making decisions? 0 0  Walking or climbing stairs? 0 0  Dressing or bathing? 0 0  Doing errands, shopping? 0 0  Preparing Food and eating ? N N  Using the Toilet? N N  In the past six months, have you accidently leaked urine? Y Y  Do you have problems with loss of bowel control? N N  Managing your Medications? N N  Managing your Finances? N N  Housekeeping or managing your Housekeeping? N N    Patient Care Team: Christen Butter, NP as PCP - General (Nurse Practitioner) Louann Sjogren, DPM as Consulting Physician (Podiatry) Armbruster, Willaim Rayas, MD as Consulting Physician (Gastroenterology)  Indicate any recent Medical Services you may have received from other than [facility] providers in the past year (date may be approximate).     Assessment:   This is a routine wellness examination for Sabrina Abbott.  Hearing/Vision screen No results found.   Goals Addressed             This Visit's Progress    Activity and Exercise Increased       She would like to be more active and drink more water.       Depression Screen    09/18/2023    7:59 AM 10/01/2022    8:49 AM 09/10/2022    9:04 AM 08/16/2022    1:57 PM 09/04/2021    8:27 AM 06/22/2021    9:57 AM  03/30/2021    9:02 AM  PHQ 2/9 Scores  PHQ - 2 Score 0 0 0 0 0 0 0    Fall Risk    09/18/2023    8:01 AM 09/14/2023    3:45 PM 10/01/2022    8:49 AM 09/10/2022    9:04 AM 09/06/2022    9:51 AM  Fall Risk   Falls in the past year? 1 1 1 1 1   Number falls in past yr: 0 0 1 1 1   Injury with Fall? 0 0 1 1 1   Risk for fall due to : No Fall Risks  History of fall(s) History of fall(s)   Follow up Falls evaluation completed  Falls evaluation completed Falls evaluation completed;Education provided;Falls prevention discussed     MEDICARE RISK AT HOME: Medicare Risk at Home Any stairs in or around the home?: Yes If so, are there any without handrails?: No Home free of loose throw rugs in walkways, pet beds, electrical cords, etc?: Yes Adequate lighting in your home to reduce risk of falls?: Yes Life alert?: No Use of a cane, walker or w/c?: No Grab bars in the bathroom?: No Shower chair or bench in shower?: No Elevated toilet seat or a handicapped toilet?: No  TIMED UP AND GO:  Was the test performed?  No    Cognitive Function:        09/18/2023    8:02 AM 09/10/2022    9:08 AM 09/04/2021    8:36 AM  6CIT Screen  What Year? 0 points 0 points 0 points  What month? 0 points 0 points 0 points  What time? 0 points 0 points 0 points  Count back from 20 0 points 0 points 0 points  Months in reverse 0 points 0 points 0 points  Repeat phrase 0 points 0 points 0 points  Total Score 0 points 0 points 0 points    Immunizations Immunization History  Administered Date(s) Administered   Fluad Quad(high Dose 65+) 03/10/2019, 03/17/2020, 03/30/2021   Influenza,  High Dose Seasonal PF 05/09/2015, 04/29/2023   Influenza,inj,Quad PF,6+ Mos 03/27/2018   Influenza-Unspecified 04/14/2012, 04/28/2013, 05/03/2014, 05/14/2016, 04/02/2017, 05/22/2022   PFIZER(Purple Top)SARS-COV-2 Vaccination 07/31/2019, 08/18/2019   PNEUMOCOCCAL CONJUGATE-20 10/01/2022   Pfizer Covid-19 Vaccine Bivalent Booster 36yrs  & up 04/08/2020, 10/28/2020, 06/15/2022   Pneumococcal Polysaccharide-23 03/06/2018   Tdap 07/09/2005   Unspecified SARS-COV-2 Vaccination 05/09/2023   Zoster, Live 12/23/2013    TDAP status: Due, Education has been provided regarding the importance of this vaccine. Advised may receive this vaccine at local pharmacy or Health Dept. Aware to provide a copy of the vaccination record if obtained from local pharmacy or Health Dept. Verbalized acceptance and understanding.  Flu Vaccine status: Up to date  Pneumococcal vaccine status: Up to date  Covid-19 vaccine status: Completed vaccines  Qualifies for Shingles Vaccine? Yes   Zostavax completed Yes   Shingrix Completed?: No.    Education has been provided regarding the importance of this vaccine. Patient has been advised to call insurance company to determine out of pocket expense if they have not yet received this vaccine. Advised may also receive vaccine at local pharmacy or Health Dept. Verbalized acceptance and understanding.  Screening Tests Health Maintenance  Topic Date Due   DTaP/Tdap/Td (2 - Td or Tdap) 07/10/2015   COVID-19 Vaccine (7 - Pfizer risk 2024-25 season) 11/06/2023   Medicare Annual Wellness (AWV)  09/17/2024   MAMMOGRAM  06/18/2025   Colonoscopy  06/25/2026   Pneumonia Vaccine 8+ Years old  Completed   INFLUENZA VACCINE  Completed   DEXA SCAN  Completed   Hepatitis C Screening  Completed   HPV VACCINES  Aged Out   Fecal DNA (Cologuard)  Discontinued   Zoster Vaccines- Shingrix  Discontinued    Health Maintenance  Health Maintenance Due  Topic Date Due   DTaP/Tdap/Td (2 - Td or Tdap) 07/10/2015    Colorectal cancer screening: Type of screening: Colonoscopy. Completed 06/26/2023. Repeat every 3 years  Mammogram status: Completed 06/19/2023. Repeat every year  Bone Density status: Completed 10/17/2022. Results reflect: Bone density results: NORMAL. Repeat every 3 years.  Lung Cancer Screening: (Low Dose  CT Chest recommended if Age 45-80 years, 20 pack-year currently smoking OR have quit w/in 15years.) does not qualify.   Lung Cancer Screening Referral: n/a  Additional Screening:  Hepatitis C Screening: does qualify; Completed 03/10/2019  Vision Screening: Recommended annual ophthalmology exams for early detection of glaucoma and other disorders of the eye. Is the patient up to date with their annual eye exam?  Yes  Who is the provider or what is the name of the office in which the patient attends annual eye exams? [facility] If pt is not established with a provider, would they like to be referred to a provider to establish care?  N/a .   Dental Screening: Recommended annual dental exams for proper oral hygiene    Community Resource Referral / Chronic Care Management: CRR required this visit?  No   CCM required this visit?  No     Plan:     I have personally reviewed and noted the following in the patient's chart:   Medical and social history Use of alcohol, tobacco or illicit drugs  Current medications and supplements including opioid prescriptions. Patient is not currently taking opioid prescriptions. Functional ability and status Nutritional status Physical activity Advanced directives List of other physicians Hospitalizations, surgeries, and ER visits in previous 12 months Vitals Screenings to include cognitive, depression, and falls Referrals and appointments  In  addition, I have reviewed and discussed with patient certain preventive protocols, quality metrics, and best practice recommendations. A written personalized care plan for preventive services as well as general preventive health recommendations were provided to patient.     Esmond Harps, CMA   09/18/2023   After Visit Summary: (MyChart) Due to this being a telephonic visit, the after visit summary with patients personalized plan was offered to patient via MyChart   Nurse Notes:   Sabrina Abbott  is a 73 y.o. female patient of Christen Butter, NP who had a Medicare Annual Wellness Visit today via telephone. Sabrina Abbott is Retired and lives with their daughter. she has 2 children. She reports that she is socially active and does interact with friends/family regularly. She is moderately physically active and enjoys baking and socializing with her friends. She does do the silver sneakers daily.

## 2023-10-08 ENCOUNTER — Telehealth: Payer: Self-pay | Admitting: Medical-Surgical

## 2023-10-08 MED ORDER — AMBULATORY NON FORMULARY MEDICATION: 0 refills | Status: AC

## 2023-10-08 NOTE — Addendum Note (Signed)
 Addended byChristen Butter on: 10/08/2023 01:55 PM   Modules accepted: Orders

## 2023-10-08 NOTE — Telephone Encounter (Signed)
 Prescription printed and placed in Carol's box to be faxed.  ___________________________________________ Thayer Ohm, DNP, APRN, FNP-BC Primary Care and Sports Medicine [facility] [place]

## 2023-10-08 NOTE — Telephone Encounter (Signed)
 Copied from CRM [id number]. Topic: General - Other >> Oct 08, 2023  1:06 PM Emylou G wrote: Reason for CRM: Tonya w/[employer] dme prescription for breast prosthesus bras.... calling to check status of prescription fax [phone] ph [phone]

## 2023-10-10 DIAGNOSIS — C50911 Malignant neoplasm of unspecified site of right female breast: Secondary | ICD-10-CM | POA: Diagnosis not present

## 2023-11-20 DIAGNOSIS — C50911 Malignant neoplasm of unspecified site of right female breast: Secondary | ICD-10-CM | POA: Diagnosis not present

## 2024-01-30 ENCOUNTER — Other Ambulatory Visit: Payer: Self-pay | Admitting: Medical-Surgical

## 2024-02-19 ENCOUNTER — Other Ambulatory Visit: Payer: Self-pay | Admitting: Medical-Surgical

## 2024-02-19 DIAGNOSIS — Z1231 Encounter for screening mammogram for malignant neoplasm of breast: Secondary | ICD-10-CM

## 2024-04-01 ENCOUNTER — Encounter: Payer: Self-pay | Admitting: Medical-Surgical

## 2024-04-01 DIAGNOSIS — Z1231 Encounter for screening mammogram for malignant neoplasm of breast: Secondary | ICD-10-CM

## 2024-05-04 ENCOUNTER — Other Ambulatory Visit: Payer: Self-pay | Admitting: Medical Genetics

## 2024-05-04 DIAGNOSIS — Z006 Encounter for examination for normal comparison and control in clinical research program: Secondary | ICD-10-CM

## 2024-05-29 ENCOUNTER — Ambulatory Visit (INDEPENDENT_AMBULATORY_CARE_PROVIDER_SITE_OTHER)

## 2024-05-29 ENCOUNTER — Ambulatory Visit

## 2024-05-29 ENCOUNTER — Encounter: Payer: Self-pay | Admitting: Medical-Surgical

## 2024-05-29 ENCOUNTER — Ambulatory Visit: Payer: Self-pay | Admitting: Medical-Surgical

## 2024-05-29 ENCOUNTER — Ambulatory Visit (INDEPENDENT_AMBULATORY_CARE_PROVIDER_SITE_OTHER): Admitting: Medical-Surgical

## 2024-05-29 VITALS — BP 146/80 | HR 62 | Resp 20 | Ht 68.0 in | Wt 198.4 lb

## 2024-05-29 DIAGNOSIS — R202 Paresthesia of skin: Secondary | ICD-10-CM | POA: Diagnosis not present

## 2024-05-29 DIAGNOSIS — M47815 Spondylosis without myelopathy or radiculopathy, thoracolumbar region: Secondary | ICD-10-CM | POA: Diagnosis not present

## 2024-05-29 DIAGNOSIS — M79669 Pain in unspecified lower leg: Secondary | ICD-10-CM | POA: Diagnosis not present

## 2024-05-29 DIAGNOSIS — M79661 Pain in right lower leg: Secondary | ICD-10-CM | POA: Diagnosis not present

## 2024-05-29 DIAGNOSIS — M4316 Spondylolisthesis, lumbar region: Secondary | ICD-10-CM | POA: Diagnosis not present

## 2024-05-29 DIAGNOSIS — M47816 Spondylosis without myelopathy or radiculopathy, lumbar region: Secondary | ICD-10-CM | POA: Diagnosis not present

## 2024-05-29 NOTE — Progress Notes (Cosign Needed Addendum)
   Acute Office Visit  Subjective:     Patient ID: Sabrina Abbott, female    DOB: 1950/10/20, 73 y.o.   MRN: [id number]  Chief Complaint  Patient presents with   Medical Management of Chronic Issues    Right foot feels like a pin/prickly feeling Right calf cramping Started 3 weeks ago    HPI Patient is in today for having muscle cramps in her right calf that she [place]. She states that cramps were bad enough to interfere with walking. Taking a break and drinking some water helped relieve symptoms. Calf continues to have some soreness since that episode. States on Tuesday she started noticing a pins and needles feeling in her right foot. Symptoms occurs daily. Denies having any edema, redness, warmth, shortness of breath, loss of balance or falls, low back pian, or pain radiating down her leg. Patient has a diagnosis PTTD which is currently followed by podiatry.  Review of Systems  Constitutional: Negative.   HENT: Negative.    Eyes: Negative.   Respiratory: Negative.    Cardiovascular: Negative.   Gastrointestinal: Negative.   Genitourinary: Negative.   Musculoskeletal:  Positive for myalgias.  Skin: Negative.   Neurological:  Positive for tingling.  Endo/Heme/Allergies: Negative.   Psychiatric/Behavioral: Negative.          Objective:    BP (!) 146/80 (BP Location: Left Arm, Cuff Size: Normal)   Pulse 62   Resp 20   Ht 5' 8 (1.727 m)   Wt 90 kg   SpO2 100%   BMI 30.17 kg/m  BP Readings from Last 3 Encounters:  05/29/24 (!) 146/80  06/26/23 127/65  06/03/23 120/80      Physical Exam Vitals and nursing note reviewed.  Constitutional:      General: She is not in acute distress.    Appearance: Normal appearance.  Cardiovascular:     Rate and Rhythm: Normal rate and regular rhythm.     Pulses: Normal pulses.          Posterior tibial pulses are 2+ on the right side.     Heart sounds: Normal heart sounds.  Pulmonary:      Effort: Pulmonary effort is normal.     Breath sounds: Normal breath sounds.  Musculoskeletal:     Right lower leg: 1+ Edema present.  Neurological:     General: No focal deficit present.     Mental Status: She is alert and oriented to person, place, and time.  Psychiatric:        Mood and Affect: Mood normal.        Behavior: Behavior normal.        Thought Content: Thought content normal.        Judgment: Judgment normal.     No results found for any visits on 05/29/24.      Assessment & Plan:  1. Paresthesia of lower extremity (Primary) - X-Ray lower back to rule out nerve compression or degenerative changes - Lower leg U/S ordered to rule out DVT - Recommended use of support stockings to help with circulation due to varicosities - US  Venous Img Lower Unilateral Right; Future - DG Lumbar Spine Complete; Future    Return if symptoms worsen or fail to improve.  Derrek JINNY Freund, NP Student

## 2024-05-30 ENCOUNTER — Encounter: Payer: Self-pay | Admitting: Medical-Surgical

## 2024-05-30 NOTE — Progress Notes (Signed)
 Medical screening examination/treatment was performed by qualified clinical staff member and as supervising provider I was immediately available for consultation/collaboration. I have reviewed documentation and agree with assessment and plan.  Thayer Ohm, DNP, APRN, FNP-BC [facility] and Sports Medicine

## 2024-06-05 LAB — GENECONNECT MOLECULAR SCREEN: Genetic Analysis Overall Interpretation: NEGATIVE

## 2024-06-22 ENCOUNTER — Ambulatory Visit (HOSPITAL_BASED_OUTPATIENT_CLINIC_OR_DEPARTMENT_OTHER)

## 2024-06-22 ENCOUNTER — Encounter (HOSPITAL_BASED_OUTPATIENT_CLINIC_OR_DEPARTMENT_OTHER): Payer: Self-pay

## 2024-06-22 ENCOUNTER — Inpatient Hospital Stay (HOSPITAL_BASED_OUTPATIENT_CLINIC_OR_DEPARTMENT_OTHER)
Admission: RE | Admit: 2024-06-22 | Discharge: 2024-06-22 | Disposition: A | Source: Ambulatory Visit | Attending: Medical-Surgical

## 2024-06-22 DIAGNOSIS — Z1231 Encounter for screening mammogram for malignant neoplasm of breast: Secondary | ICD-10-CM

## 2024-06-25 ENCOUNTER — Ambulatory Visit: Payer: Self-pay | Admitting: Medical-Surgical

## 2024-07-07 ENCOUNTER — Encounter: Payer: Self-pay | Admitting: Medical-Surgical

## 2024-07-07 ENCOUNTER — Ambulatory Visit (INDEPENDENT_AMBULATORY_CARE_PROVIDER_SITE_OTHER): Admitting: Medical-Surgical

## 2024-07-07 VITALS — BP 127/74 | HR 71 | Ht 68.0 in | Wt 196.0 lb

## 2024-07-07 DIAGNOSIS — I10 Essential (primary) hypertension: Secondary | ICD-10-CM

## 2024-07-07 MED ORDER — AMLODIPINE BESYLATE 5 MG PO TABS: 5.0000 mg | ORAL_TABLET | Freq: Every day | ORAL | 3 refills | Status: AC

## 2024-07-07 NOTE — Progress Notes (Signed)
" ° °       Established patient visit   History of Present Illness   Discussed the use of AI scribe software for clinical note transcription with the patient, who gave verbal consent to proceed.  History of Present Illness   Sabrina Abbott is a 73 year old female with hypertension who presents for blood pressure management.  Hypertension - Consistent home blood pressure readings above 130/80 mmHg - Home measurements performed with an arm cuff, often with arm hanging down  - Takes amlodipine  5 mg and chlorthalidone  25mg  daily - No reported side effects or intolerances   Physical Exam   Physical Exam Vitals reviewed.  Constitutional:      General: She is not in acute distress.    Appearance: Normal appearance. She is not ill-appearing.  HENT:     Head: Normocephalic and atraumatic.  Cardiovascular:     Rate and Rhythm: Normal rate and regular rhythm.  Pulmonary:     Effort: Pulmonary effort is normal. No respiratory distress.  Skin:    General: Skin is warm and dry.  Neurological:     Mental Status: She is alert and oriented to person, place, and time.  Psychiatric:        Mood and Affect: Mood normal.        Behavior: Behavior normal.        Thought Content: Thought content normal.        Judgment: Judgment normal.    Assessment & Plan   Primary hypertension Blood pressure normal today. Home readings elevated above goal. Amlodipine  5 mg and chlorthalidone  are well tolerated. Goal is < 130/80 mmHg but with her age, ok with <140/90. - Continue amlodipine  5 mg daily. - Continue chlorthalidone  25mg  daily.  - Monitor blood pressure at home with proper technique, ensuring arm is at heart level for greater accuracy.  General Health Maintenance Tetanus vaccination overdue. - Schedule tetanus vaccination at the pharmacy or health department.     Follow up   Return for follow up in March as scheduled. __________________________________ Zada FREDRIK Palin, DNP, APRN,  FNP-BC Primary Care and Sports Medicine [facility] "

## 2024-07-28 ENCOUNTER — Other Ambulatory Visit: Payer: Self-pay | Admitting: Medical-Surgical

## 2024-08-13 ENCOUNTER — Other Ambulatory Visit: Payer: Self-pay | Admitting: Medical-Surgical

## 2024-09-22 ENCOUNTER — Ambulatory Visit

## 2024-09-30 ENCOUNTER — Ambulatory Visit: Payer: Medicare HMO | Admitting: Medical-Surgical
